# Patient Record
Sex: Female | Born: 2000 | Race: Black or African American | Hispanic: No | Marital: Single | Smoking: Current every day smoker
Health system: Southern US, Community
[De-identification: ages and names within clinical notes are randomized; demographics above are authoritative.]

## PROBLEM LIST (undated history)

## (undated) ENCOUNTER — Emergency Department (HOSPITAL_COMMUNITY): Admission: EM | Payer: Medicaid Other | Source: Home / Self Care

---
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
## 2020-04-20 ENCOUNTER — Encounter (HOSPITAL_COMMUNITY): Payer: Self-pay | Admitting: Emergency Medicine

## 2020-04-20 ENCOUNTER — Emergency Department (HOSPITAL_COMMUNITY)
Admission: EM | Admit: 2020-04-20 | Discharge: 2020-04-21 | Disposition: A | Discharge status: Home / Self Care | Payer: Medicaid Other | Attending: Emergency Medicine | Admitting: Emergency Medicine

## 2020-04-20 DIAGNOSIS — Z5321 Procedure and treatment not carried out due to patient leaving prior to being seen by health care provider: Secondary | ICD-10-CM | POA: Diagnosis not present

## 2020-04-20 DIAGNOSIS — R300 Dysuria: Secondary | ICD-10-CM | POA: Insufficient documentation

## 2020-04-20 DIAGNOSIS — N899 Noninflammatory disorder of vagina, unspecified: Secondary | ICD-10-CM | POA: Insufficient documentation

## 2020-04-20 NOTE — ED Notes (Signed)
Pt called for rooming. No answer x2!

## 2020-04-20 NOTE — ED Notes (Signed)
No answer from lobby  

## 2020-04-20 NOTE — ED Triage Notes (Signed)
Pt reports vaginal discharged that is white and dysuria for week.

## 2020-04-21 NOTE — ED Notes (Signed)
Pt called for rooming x3! No answer! 

## 2020-04-25 ENCOUNTER — Emergency Department (HOSPITAL_COMMUNITY)
Admission: EM | Admit: 2020-04-25 | Discharge: 2020-04-25 | Disposition: A | Discharge status: Home / Self Care | Payer: Medicaid Other | Attending: Emergency Medicine | Admitting: Emergency Medicine

## 2020-04-25 ENCOUNTER — Encounter (HOSPITAL_COMMUNITY): Payer: Self-pay | Admitting: Emergency Medicine

## 2020-04-25 ENCOUNTER — Other Ambulatory Visit: Payer: Self-pay

## 2020-04-25 DIAGNOSIS — R300 Dysuria: Secondary | ICD-10-CM

## 2020-04-25 DIAGNOSIS — N760 Acute vaginitis: Secondary | ICD-10-CM | POA: Diagnosis not present

## 2020-04-25 LAB — WET PREP, GENITAL
Clue Cells Wet Prep HPF POC: NONE SEEN
Sperm: NONE SEEN
Trich, Wet Prep: NONE SEEN
Yeast Wet Prep HPF POC: NONE SEEN

## 2020-04-25 LAB — URINALYSIS, ROUTINE W REFLEX MICROSCOPIC
Bacteria, UA: NONE SEEN
Bilirubin Urine: NEGATIVE
Glucose, UA: NEGATIVE mg/dL
Hgb urine dipstick: NEGATIVE
Ketones, ur: NEGATIVE mg/dL
Nitrite: NEGATIVE
Protein, ur: NEGATIVE mg/dL
Specific Gravity, Urine: 1.016 (ref 1.005–1.030)
pH: 6 (ref 5.0–8.0)

## 2020-04-25 LAB — POC URINE PREG, ED: Preg Test, Ur: NEGATIVE

## 2020-04-25 MED ORDER — FLUCONAZOLE 100 MG PO TABS
100.0000 mg | ORAL_TABLET | Freq: Every day | ORAL | 0 refills | Status: DC
Start: 2020-04-30 — End: 2021-03-15

## 2020-04-25 MED ORDER — NITROFURANTOIN MONOHYD MACRO 100 MG PO CAPS
100.0000 mg | ORAL_CAPSULE | Freq: Two times a day (BID) | ORAL | 0 refills | Status: DC
Start: 2020-04-25 — End: 2021-11-09

## 2020-04-25 NOTE — ED Provider Notes (Signed)
[facility] DEPT Provider Note   CSN: [id number] Arrival date & time: 04/25/20  1057     History Chief Complaint  Patient presents with  . Vaginal Discharge  . Dysuria    [facility] is a 19 y.o. female.  HPI     19 year old female comes in a chief complaint of burning with urination.  Patient reports that she has been having some burning with urination and itching in the vaginal area for the last week.  She denies any abdominal pain.  She has mild vaginal discharge.  No recent antibiotic use.  Patient is having unprotected intercourse with her partner.  No recent history of STI. History reviewed. No pertinent past medical history.  There are no problems to display for this patient.   History reviewed. No pertinent surgical history.   OB History   No obstetric history on file.     No family history on file.  Social History   Tobacco Use  . Smoking status: Never Smoker  . Smokeless tobacco: Never Used  Substance Use Topics  . Alcohol use: No  . Drug use: Not on file    Home Medications Prior to Admission medications   Medication Sig Start Date End Date Taking? Authorizing Provider  cephALEXin (KEFLEX) 500 MG capsule Take 1 capsule (500 mg total) by mouth 2 (two) times daily. Patient not taking: Reported on 09/16/2018 01/10/18   Langston Masker B, PA-C  fluconazole (DIFLUCAN) 100 MG tablet Take 1 tablet (100 mg total) by mouth daily. To be taken after finishing antibiotics for uti 04/30/20   Varney Biles, MD  metroNIDAZOLE (FLAGYL) 500 MG tablet Take 1 tablet (500 mg total) by mouth 2 (two) times daily. Patient not taking: Reported on 09/16/2018 01/10/18   Albesa Seen, PA-C  nitrofurantoin, macrocrystal-monohydrate, (MACROBID) 100 MG capsule Take 1 capsule (100 mg total) by mouth 2 (two) times daily. 04/25/20   Varney Biles, MD  ondansetron (ZOFRAN) 4 MG tablet Take 1 tablet (4 mg total) by mouth every 6 (six)  hours. Patient not taking: Reported on 01/10/2018 09/03/15   Louanne Skye, MD    Allergies    Patient has no known allergies.  Review of Systems   Review of Systems  Constitutional: Positive for activity change.  Genitourinary: Positive for dysuria and vaginal discharge.    Physical Exam Updated Vital Signs BP 129/87   Pulse 65   Temp 98.3 F (36.8 C) (Oral)   Resp 16   LMP 04/05/2020   SpO2 98%   Physical Exam Vitals and nursing note reviewed.  Constitutional:      Appearance: She is well-developed.  HENT:     Head: Normocephalic and atraumatic.  Cardiovascular:     Rate and Rhythm: Normal rate.  Pulmonary:     Effort: Pulmonary effort is normal.  Abdominal:     General: Bowel sounds are normal.  Genitourinary:    Vagina: Vaginal discharge present.  Musculoskeletal:     Cervical back: Normal range of motion and neck supple.  Skin:    General: Skin is warm and dry.  Neurological:     Mental Status: She is alert and oriented to person, place, and time.     ED Results / Procedures / Treatments   Labs (all labs ordered are listed, but only abnormal results are displayed) Labs Reviewed  WET PREP, GENITAL - Abnormal; Notable for the following components:      Result Value   WBC, Wet Prep HPF  POC FEW (*)    All other components within normal limits  URINALYSIS, ROUTINE W REFLEX MICROSCOPIC - Abnormal; Notable for the following components:   APPearance HAZY (*)    Leukocytes,Ua LARGE (*)    All other components within normal limits  POC URINE PREG, ED  GC/CHLAMYDIA PROBE AMP ([place]) NOT AT [facility]    EKG None  Radiology No results found.  Procedures Procedures (including critical care time)  Medications Ordered in ED Medications - No data to display  ED Course  I have reviewed the triage vital signs and the nursing notes.  Pertinent labs & imaging results that were available during my care of the patient were reviewed by me and considered in my  medical decision making (see chart for details).    MDM Rules/Calculators/A&P                          19 year old female comes in a chief complaint of burning with urination and vaginal discharge. We will start her on Macrobid for suspected UTI. Diflucan has been given, she has been advised to take it after her antibiotic course. No abdominal pain.  Doubt PID.  Final Clinical Impression(s) / ED Diagnoses Final diagnoses:  Dysuria  Acute vaginitis    Rx / DC Orders ED Discharge Orders         Ordered    fluconazole (DIFLUCAN) 100 MG tablet  Daily     Discontinue  Reprint     04/25/20 1524    nitrofurantoin, macrocrystal-monohydrate, (MACROBID) 100 MG capsule  2 times daily     Discontinue  Reprint     04/25/20 1523           Derwood Kaplan, MD 04/25/20 1558

## 2020-04-25 NOTE — ED Notes (Signed)
PT provided urine specimen cup. 

## 2020-04-25 NOTE — ED Notes (Signed)
Contacted lab regarding wet prep. Female staff stated he would look for specimens at this time.

## 2020-04-25 NOTE — ED Triage Notes (Signed)
Pt c/o dysuria and vaginal irritation and white discharge for over a week.

## 2020-04-25 NOTE — ED Notes (Signed)
Visitor at bedside.

## 2020-04-25 NOTE — Discharge Instructions (Addendum)
Please take the antibiotics for a bladder infection.  We have also given you prescription for Diflucan, you need to take that medication after you have completed taking UTI antibiotics to treat for yeast infection.

## 2020-04-26 LAB — GC/CHLAMYDIA PROBE AMP (~~LOC~~) NOT AT ARMC
Chlamydia: NEGATIVE
Comment: NEGATIVE
Comment: NORMAL
Neisseria Gonorrhea: NEGATIVE

## 2020-08-17 ENCOUNTER — Other Ambulatory Visit: Payer: Medicaid Other

## 2020-08-17 ENCOUNTER — Ambulatory Visit: Payer: Medicaid Other

## 2020-11-16 ENCOUNTER — Other Ambulatory Visit: Payer: Self-pay

## 2020-11-21 ENCOUNTER — Other Ambulatory Visit: Payer: Self-pay

## 2020-11-21 ENCOUNTER — Emergency Department (HOSPITAL_COMMUNITY)
Admission: EM | Admit: 2020-11-21 | Discharge: 2020-11-21 | Disposition: A | Discharge status: Home / Self Care | Payer: Medicaid Other | Attending: Emergency Medicine | Admitting: Emergency Medicine

## 2020-11-21 ENCOUNTER — Encounter (HOSPITAL_COMMUNITY): Payer: Self-pay

## 2020-11-21 DIAGNOSIS — N3090 Cystitis, unspecified without hematuria: Secondary | ICD-10-CM

## 2020-11-21 DIAGNOSIS — R350 Frequency of micturition: Secondary | ICD-10-CM

## 2020-11-21 DIAGNOSIS — R300 Dysuria: Secondary | ICD-10-CM | POA: Diagnosis present

## 2020-11-21 LAB — PREGNANCY, URINE: Preg Test, Ur: NEGATIVE

## 2020-11-21 LAB — URINALYSIS, ROUTINE W REFLEX MICROSCOPIC
Bilirubin Urine: NEGATIVE
Glucose, UA: NEGATIVE mg/dL
Hgb urine dipstick: NEGATIVE
Ketones, ur: NEGATIVE mg/dL
Leukocytes,Ua: NEGATIVE
Nitrite: NEGATIVE
Protein, ur: NEGATIVE mg/dL
Specific Gravity, Urine: 1.026 (ref 1.005–1.030)
pH: 5 (ref 5.0–8.0)

## 2020-11-21 LAB — CBG MONITORING, ED: Glucose-Capillary: 76 mg/dL (ref 70–99)

## 2020-11-21 LAB — I-STAT BETA HCG BLOOD, ED (MC, WL, AP ONLY): I-stat hCG, quantitative: <5 m[IU]/mL (ref <5)

## 2020-11-21 NOTE — ED Provider Notes (Signed)
[facility]   CSN: [id number] Arrival date & time: 11/21/20  1733     History Chief Complaint  Patient presents with  . Urinary Frequency  . Dysuria    [facility] is a 20 y.o. female with no pertinent past medical history that presents to the emergency department today for dysuria and urinary frequency for the past week and a half.  Denies any hematuria, denies any chance of pregnancy.  Patient states that she otherwise feels fine.  Denies any suprapubic pain, abdominal pain, back pain, nausea, vomiting, fevers, chills.  Denies any chest pain.  Patient states that she is generally healthy.  States that she has had UTIs in the past, feels similar.  Denies any vaginal symptoms, vaginal discharge or pelvic pain.  Denies any vaginal itching.  Patient states that she is sure that dysuria is coming from her urethra and on her vagina.  No chance of STDs.  No other complaints at this time.  Has not tried anything for this.  HPI     History reviewed. No pertinent past medical history.  There are no problems to display for this patient.   History reviewed. No pertinent surgical history.   OB History   No obstetric history on file.     Family History  Problem Relation Age of Onset  . Healthy Mother   . Healthy Father     Social History   Tobacco Use  . Smoking status: Never Smoker  . Smokeless tobacco: Never Used  Vaping Use  . Vaping Use: Every day  . Substances: Nicotine  Substance Use Topics  . Alcohol use: No  . Drug use: Yes    Types: Marijuana    Home Medications Prior to Admission medications   Medication Sig Start Date End Date Taking? Authorizing Provider  cephALEXin (KEFLEX) 500 MG capsule Take 1 capsule (500 mg total) by mouth 2 (two) times daily. Patient not taking: Reported on 09/16/2018 01/10/18   Aviva Kluver B, PA-C  fluconazole (DIFLUCAN) 100 MG tablet Take 1 tablet (100 mg total) by mouth  daily. To be taken after finishing antibiotics for uti 04/30/20   Derwood Kaplan, MD  metroNIDAZOLE (FLAGYL) 500 MG tablet Take 1 tablet (500 mg total) by mouth 2 (two) times daily. Patient not taking: Reported on 09/16/2018 01/10/18   Elisha Ponder, PA-C  nitrofurantoin, macrocrystal-monohydrate, (MACROBID) 100 MG capsule Take 1 capsule (100 mg total) by mouth 2 (two) times daily. 04/25/20   Derwood Kaplan, MD  ondansetron (ZOFRAN) 4 MG tablet Take 1 tablet (4 mg total) by mouth every 6 (six) hours. Patient not taking: Reported on 01/10/2018 09/03/15   Niel Hummer, MD    Allergies    Patient has no known allergies.  Review of Systems   Review of Systems  Constitutional: Negative for diaphoresis, fatigue and fever.  Eyes: Negative for visual disturbance.  Respiratory: Negative for shortness of breath.   Cardiovascular: Negative for chest pain.  Gastrointestinal: Negative for nausea and vomiting.  Genitourinary: Positive for dysuria and frequency. Negative for decreased urine volume, enuresis, flank pain, genital sores, hematuria, menstrual problem, pelvic pain, urgency, vaginal bleeding and vaginal discharge.  Musculoskeletal: Negative for back pain and myalgias.  Skin: Negative for color change, pallor, rash and wound.  Neurological: Negative for syncope, weakness, light-headedness, numbness and headaches.  Psychiatric/Behavioral: Negative for behavioral problems and confusion.    Physical Exam Updated Vital Signs BP 122/70 (BP Location: Left Arm)   Pulse  92   Temp 98.7 F (37.1 C) (Oral)   Resp 16   Ht 5\' 3"  (1.6 m)   Wt 58.5 kg   LMP 11/03/2020 (Approximate)   SpO2 97%   BMI 22.85 kg/m   Physical Exam Constitutional:      General: She is not in acute distress.    Appearance: Normal appearance. She is not ill-appearing, toxic-appearing or diaphoretic.  HENT:     Head: Normocephalic and atraumatic.  Eyes:     Extraocular Movements: Extraocular movements intact.      Pupils: Pupils are equal, round, and reactive to light.  Cardiovascular:     Rate and Rhythm: Normal rate and regular rhythm.     Pulses: Normal pulses.  Pulmonary:     Effort: Pulmonary effort is normal.     Breath sounds: Normal breath sounds.  Abdominal:     General: Abdomen is flat. There is no distension.     Tenderness: There is no abdominal tenderness. There is no right CVA tenderness, left CVA tenderness or guarding.  Musculoskeletal:        General: Normal range of motion.  Skin:    General: Skin is warm and dry.     Capillary Refill: Capillary refill takes less than 2 seconds.  Neurological:     General: No focal deficit present.     Mental Status: She is alert and oriented to person, place, and time. Mental status is at baseline.  Psychiatric:        Mood and Affect: Mood normal.        Behavior: Behavior normal.        Thought Content: Thought content normal.     ED Results / Procedures / Treatments   Labs (all labs ordered are listed, but only abnormal results are displayed) Labs Reviewed  URINALYSIS, ROUTINE W REFLEX MICROSCOPIC - Abnormal; Notable for the following components:      Result Value   APPearance HAZY (*)    All other components within normal limits  PREGNANCY, URINE  I-STAT BETA HCG BLOOD, ED ([facility], [facility], [facility] ONLY)  CBG MONITORING, ED    EKG None  Radiology No results found.  Procedures Procedures (including critical care time)  Medications Ordered in ED Medications - No data to display  ED Course  I have reviewed the triage vital signs and the nursing notes.  Pertinent labs & imaging results that were available during my care of the patient were reviewed by me and considered in my medical decision making (see chart for details).    MDM Rules/Calculators/A&P                          [facility] is a 20 y.o. female with no pertinent past medical history that presents to the emergency department today for dysuria and urinary  frequency for the past week and a half.   Urinalysis negative for infection.CBG 76, did give some cranberry juice. Patient with normal vital signs, no concerns for pyelonephritis.  Patient to be discharged at this time, symptomatic treatment discussed.  Patient follow-up with PCP.  Doubt need for further emergent work up at this time. I explained the diagnosis and have given explicit precautions to return to the ER including for any other new or worsening symptoms. The patient understands and accepts the medical plan as it's been dictated and I have answered their questions. Discharge instructions concerning home care and prescriptions have been given. The patient  is STABLE and is discharged to home in good condition.    Final Clinical Impression(s) / ED Diagnoses Final diagnoses:  Cystitis  Urinary frequency    Rx / DC Orders ED Discharge Orders    None       Farrel Gordon, PA-C 11/21/20 2203    Rozelle Logan, DO 11/21/20 2339

## 2020-11-21 NOTE — ED Triage Notes (Addendum)
Patient c/o dysuria and  Urinary frequncy x 1 1/2 weeks.

## 2020-11-21 NOTE — Discharge Instructions (Addendum)
Your urine was clean today, as we discussed, should follow-up with your primary care provider, if you do not have 1 you can go to [facility].  You can take the over-the-counter cranberry pills as we discussed.  If you have any new or worsening concerning symptoms please come back to the emergency department.

## 2021-03-14 ENCOUNTER — Emergency Department (HOSPITAL_COMMUNITY)
Admission: EM | Admit: 2021-03-14 | Discharge: 2021-03-15 | Disposition: A | Discharge status: Home / Self Care | Payer: Medicaid Other | Attending: Emergency Medicine | Admitting: Emergency Medicine

## 2021-03-14 DIAGNOSIS — B373 Candidiasis of vulva and vagina: Secondary | ICD-10-CM | POA: Diagnosis not present

## 2021-03-14 DIAGNOSIS — R300 Dysuria: Secondary | ICD-10-CM | POA: Insufficient documentation

## 2021-03-14 DIAGNOSIS — R3915 Urgency of urination: Secondary | ICD-10-CM | POA: Diagnosis not present

## 2021-03-14 DIAGNOSIS — R350 Frequency of micturition: Secondary | ICD-10-CM | POA: Insufficient documentation

## 2021-03-14 DIAGNOSIS — Z5321 Procedure and treatment not carried out due to patient leaving prior to being seen by health care provider: Secondary | ICD-10-CM | POA: Insufficient documentation

## 2021-03-14 LAB — URINALYSIS, ROUTINE W REFLEX MICROSCOPIC
Bilirubin Urine: NEGATIVE
Glucose, UA: NEGATIVE mg/dL
Hgb urine dipstick: NEGATIVE
Ketones, ur: 5 mg/dL — AB
Leukocytes,Ua: NEGATIVE
Nitrite: NEGATIVE
Protein, ur: NEGATIVE mg/dL
Specific Gravity, Urine: 1.031 — ABNORMAL HIGH (ref 1.005–1.030)
pH: 6 (ref 5.0–8.0)

## 2021-03-14 LAB — POC URINE PREG, ED: Preg Test, Ur: NEGATIVE

## 2021-03-14 NOTE — ED Triage Notes (Signed)
Pt c/o dysuria, urinary frequency and urgency x 3 days. Denies fevers or any pain

## 2021-03-15 ENCOUNTER — Encounter (HOSPITAL_COMMUNITY): Payer: Self-pay

## 2021-03-15 ENCOUNTER — Emergency Department (HOSPITAL_COMMUNITY)
Admission: EM | Admit: 2021-03-15 | Discharge: 2021-03-15 | Disposition: A | Discharge status: Home / Self Care | Payer: Medicaid Other | Source: Home / Self Care | Attending: Emergency Medicine | Admitting: Emergency Medicine

## 2021-03-15 DIAGNOSIS — B373 Candidiasis of vulva and vagina: Secondary | ICD-10-CM

## 2021-03-15 DIAGNOSIS — B3731 Acute candidiasis of vulva and vagina: Secondary | ICD-10-CM

## 2021-03-15 LAB — URINALYSIS, ROUTINE W REFLEX MICROSCOPIC
Bilirubin Urine: NEGATIVE
Glucose, UA: NEGATIVE mg/dL
Hgb urine dipstick: NEGATIVE
Ketones, ur: NEGATIVE mg/dL
Leukocytes,Ua: NEGATIVE
Nitrite: NEGATIVE
Protein, ur: NEGATIVE mg/dL
Specific Gravity, Urine: 1.023 (ref 1.005–1.030)
pH: 5 (ref 5.0–8.0)

## 2021-03-15 LAB — WET PREP, GENITAL
Clue Cells Wet Prep HPF POC: NONE SEEN
Sperm: NONE SEEN
Trich, Wet Prep: NONE SEEN
WBC, Wet Prep HPF POC: NONE SEEN

## 2021-03-15 LAB — PREGNANCY, URINE: Preg Test, Ur: NEGATIVE

## 2021-03-15 MED ORDER — STERILE WATER FOR INJECTION IJ SOLN
INTRAMUSCULAR | Status: AC
  Filled 2021-03-15: qty 10

## 2021-03-15 MED ORDER — CEFTRIAXONE SODIUM 1 G IJ SOLR
500.0000 mg | Freq: Once | INTRAMUSCULAR | Status: AC
  Administered 2021-03-15: 500 mg via INTRAMUSCULAR
  Filled 2021-03-15: qty 10

## 2021-03-15 MED ORDER — HYDROMORPHONE HCL 1 MG/ML IJ SOLN: 1.0000 mg | Freq: Once | INTRAMUSCULAR | Status: DC

## 2021-03-15 MED ORDER — DOXYCYCLINE HYCLATE 100 MG PO CAPS
100.0000 mg | ORAL_CAPSULE | Freq: Two times a day (BID) | ORAL | 0 refills | Status: AC
Start: 2021-03-15 — End: 2021-03-22

## 2021-03-15 MED ORDER — FLUCONAZOLE 200 MG PO TABS
200.0000 mg | ORAL_TABLET | Freq: Every day | ORAL | 0 refills | Status: AC
Start: 2021-03-15 — End: 2021-03-16

## 2021-03-15 MED ORDER — DOXYCYCLINE HYCLATE 100 MG PO TABS
100.0000 mg | ORAL_TABLET | Freq: Once | ORAL | Status: AC
  Administered 2021-03-15: 100 mg via ORAL
  Filled 2021-03-15: qty 1

## 2021-03-15 NOTE — ED Notes (Signed)
An After Visit Summary was printed and given to the patient. Discharge instructions given and no further questions at this time.  

## 2021-03-15 NOTE — ED Provider Notes (Signed)
[facility]-EMERGENCY DEPT Provider Note   CSN: [id number] Arrival date & time: 03/15/21  1423     History Chief Complaint  Patient presents with  . Urinary Urgency    Stacey Nguyen is a 20 y.o. female who presents for evaluation of urinary frequency x 4 days.  She reports that she has had frequency but has not had any dysuria or hematuria.  She denies any vaginal pain, vaginal irritation, wounds.  She states that she has not had any redness or swelling of the vagina.  She has noted some increased vaginal discharge.  She states she is currently not sexually active and is not concerned about STDs.  Her LMP was 03/02/2021.  She denies any fevers, abdominal pain, nausea/vomiting.  The history is provided by the patient.       History reviewed. No pertinent past medical history.  There are no problems to display for this patient.   History reviewed. No pertinent surgical history.   OB History   No obstetric history on file.     Family History  Problem Relation Age of Onset  . Healthy Mother   . Healthy Father     Social History   Tobacco Use  . Smoking status: Never Smoker  . Smokeless tobacco: Never Used  Vaping Use  . Vaping Use: Every day  . Substances: Nicotine  Substance Use Topics  . Alcohol use: No  . Drug use: Yes    Types: Marijuana    Home Medications Prior to Admission medications   Medication Sig Start Date End Date Taking? Authorizing Provider  doxycycline (VIBRAMYCIN) 100 MG capsule Take 1 capsule (100 mg total) by mouth 2 (two) times daily for 7 days. 03/15/21 03/22/21 Yes Maxwell Caul, PA-C  fluconazole (DIFLUCAN) 200 MG tablet Take 1 tablet (200 mg total) by mouth daily for 1 day. 03/15/21 03/16/21 Yes Maxwell Caul, PA-C  cephALEXin (KEFLEX) 500 MG capsule Take 1 capsule (500 mg total) by mouth 2 (two) times daily. Patient not taking: Reported on 09/16/2018 01/10/18   Aviva Kluver B, PA-C  metroNIDAZOLE (FLAGYL) 500  MG tablet Take 1 tablet (500 mg total) by mouth 2 (two) times daily. Patient not taking: Reported on 09/16/2018 01/10/18   Elisha Ponder, PA-C  nitrofurantoin, macrocrystal-monohydrate, (MACROBID) 100 MG capsule Take 1 capsule (100 mg total) by mouth 2 (two) times daily. 04/25/20   Derwood Kaplan, MD  ondansetron (ZOFRAN) 4 MG tablet Take 1 tablet (4 mg total) by mouth every 6 (six) hours. Patient not taking: Reported on 01/10/2018 09/03/15   Niel Hummer, MD    Allergies    Patient has no known allergies.  Review of Systems   Review of Systems  Constitutional: Negative for fever.  Gastrointestinal: Negative for abdominal pain, nausea and vomiting.  Genitourinary: Positive for frequency, vaginal bleeding and vaginal discharge. Negative for dysuria, hematuria and vaginal pain.  All other systems reviewed and are negative.   Physical Exam Updated Vital Signs BP 122/73   Pulse 77   Temp 98.4 F (36.9 C) (Oral)   Resp 18   LMP 03/02/2021   SpO2 99%   Physical Exam Vitals and nursing note reviewed.  Constitutional:      Appearance: She is well-developed.  HENT:     Head: Normocephalic and atraumatic.  Eyes:     General: No scleral icterus.       Right eye: No discharge.        Left eye: No discharge.  Conjunctiva/sclera: Conjunctivae normal.  Pulmonary:     Effort: Pulmonary effort is normal.  Abdominal:     Comments: Abdomen is soft, non-distended, non-tender. No rigidity, No guarding. No peritoneal signs. No CVA tenderness noted bilaterally.   Skin:    General: Skin is warm and dry.  Neurological:     Mental Status: She is alert.  Psychiatric:        Speech: Speech normal.        Behavior: Behavior normal.     ED Results / Procedures / Treatments   Labs (all labs ordered are listed, but only abnormal results are displayed) Labs Reviewed  WET PREP, GENITAL - Abnormal; Notable for the following components:      Result Value   Yeast Wet Prep HPF POC PRESENT (*)     All other components within normal limits  URINALYSIS, ROUTINE W REFLEX MICROSCOPIC - Abnormal; Notable for the following components:   APPearance CLOUDY (*)    All other components within normal limits  PREGNANCY, URINE  GC/CHLAMYDIA PROBE AMP ([place]) NOT AT [facility]    EKG None  Radiology No results found.  Procedures Procedures   Medications Ordered in ED Medications  sterile water (preservative free) injection (has no administration in time range)  cefTRIAXone (ROCEPHIN) injection 500 mg (500 mg Intramuscular Given 03/15/21 1745)  doxycycline (VIBRA-TABS) tablet 100 mg (100 mg Oral Given 03/15/21 1745)    ED Course  I have reviewed the triage vital signs and the nursing notes.  Pertinent labs & imaging results that were available during my care of the patient were reviewed by me and considered in my medical decision making (see chart for details).    MDM Rules/Calculators/A&P                          20 year old female who presents for evaluation of urinary frequency x4 days.  She states no dysuria, hematuria, vaginal pain, vaginal bleeding.  She has had increased vaginal discharge.  She is not concerned about STDs and states she is not currently sexually active.  On initial arrival, she is afebrile nontoxic-appearing.  Vital signs are stable.  She has a benign abdominal exam.  We will plan to check urine.  Consider UTI.  History/physical exam not concerning for kidney stone, ovarian torsion, appendicitis, abscess.  Urine pregnancy is negative.  UA shows no evidence of infectious etiology.  I discussed results with patient.  We discussed further treatment options here in the emergency department.  I offered full pelvic exam for evaluation and STD testing.  We also discussed her doing self swab.  At this time, patient wanted to decline the pelvic exam.  But she was interested in doing a wet prep and STD testing by doing self swab.  Wet prep shows yeast.  No clue cells  noted.  Discussed results with patient.  I discussed with her regarding the yeast.  We will plan to treat.  We discussed that the STD testing will take a few days to come back.  Discussed treatment options.  Patient would like to go in and have presumptive treatment.  Patient with no known drug allergies.  At this time, patient exhibits no emergent life-threatening condition that require further evaluation in ED. Patient had ample opportunity for questions and discussion. All patient's questions were answered with full understanding. Strict return precautions discussed. Patient expresses understanding and agreement to plan.   Portions of this note were generated with Dragon  dictation software. Dictation errors may occur despite best attempts at proofreading.   Final Clinical Impression(s) / ED Diagnoses Final diagnoses:  Yeast vaginitis    Rx / DC Orders ED Discharge Orders         Ordered    fluconazole (DIFLUCAN) 200 MG tablet  Daily        03/15/21 1741    doxycycline (VIBRAMYCIN) 100 MG capsule  2 times daily        03/15/21 1741           Rosana Hoes 03/15/21 1942    Maia Plan, MD 03/18/21 2337

## 2021-03-15 NOTE — ED Triage Notes (Signed)
Pt presents with c/o urinary urgency for 4 days. Pt was seen for the same last night, reports she never received a call with any results.

## 2021-03-15 NOTE — ED Notes (Signed)
When attempt to collect updated vitals pt info me she was not a pt in system.

## 2021-03-15 NOTE — Discharge Instructions (Signed)
You have been treated today for an STD.   Take antibiotics as directed. Please take all of your antibiotics until finished.  As we discussed, you will take the Diflucan pill after you have completed your antibiotics.  The test results with take 2-3 days to return. If there is an abnormal result, you will be notified. If you do not hear anything, that means the results were negative. You can also log on MyChart to see the results.   Your sexual partner needs to be treated too. Do not have sexual intercourse for the next 7 days and after your partner has been treated.   Follow-up with your primary care doctor in 2-4 days. If you do not have a primary care doctor, you can use one listed in the paperwork.   Return to the Emergency Department for any fever, abdominal pain, difficulty breathing, nausea/vomiting or any other worsening or concerning symptoms.

## 2021-03-15 NOTE — ED Provider Notes (Signed)
Emergency Medicine Provider Triage Evaluation Note  Stacey Nguyen 20 y.o. female was evaluated in triage.  Pt complains of urinary urgency that has been ongoing for 4 days.  She came her last night but was never evaluated.  She comes back today because she is continue to have symptoms.  No dysuria, hematuria.  No redness, swelling of her vagina.  She has had some increased vaginal discharge.  She is not currently sexually active.  Last LMP was 03/02/2021.  No fevers, abdominal pain, nausea/vomiting.  Review of Systems  Positive: Urinary frequency Negative: Fevers, abd pain, nausea/vomiting  Physical Exam  BP 134/82   Pulse 70   Temp 98.2 F (36.8 C) (Oral)   Resp 18   Ht 5\' 4"  (1.626 m)   Wt 65.8 kg   SpO2 100%   BMI 24.89 kg/m  Gen:   Awake, no distress   HEENT:  Atraumatic  Resp:  Normal effort  Cardiac:  Normal rate  Abd:   Nondistended, nontender  MSK:   Moves extremities without difficulty  Neuro:  Speech clear   Medical Decision Making  Medically screening exam initiated at 3:55 AM.  Appropriate orders placed.  Stacey Nguyen was informed that the remainder of the evaluation will be completed by another provider, this initial triage assessment does not replace that evaluation, and the importance of remaining in the ED until their evaluation is complete.    Clinical Impression  Urinary frequency   Portions of this note were generated with Dragon dictation software. Dictation errors may occur despite best attempts at proofreading.     Artist Pais, PA-C 03/15/21 1440    05/15/21, MD 03/16/21 [id number]

## 2021-03-16 LAB — GC/CHLAMYDIA PROBE AMP (~~LOC~~) NOT AT ARMC
Chlamydia: NEGATIVE
Comment: NEGATIVE
Comment: NORMAL
Neisseria Gonorrhea: NEGATIVE

## 2021-11-09 ENCOUNTER — Other Ambulatory Visit: Payer: Self-pay

## 2021-11-09 ENCOUNTER — Encounter (HOSPITAL_COMMUNITY): Payer: Self-pay

## 2021-11-09 ENCOUNTER — Emergency Department (HOSPITAL_COMMUNITY)
Admission: EM | Admit: 2021-11-09 | Discharge: 2021-11-10 | Disposition: A | Discharge status: Home / Self Care | Payer: Medicaid Other | Attending: Emergency Medicine | Admitting: Emergency Medicine

## 2021-11-09 DIAGNOSIS — N390 Urinary tract infection, site not specified: Secondary | ICD-10-CM | POA: Insufficient documentation

## 2021-11-09 DIAGNOSIS — U071 COVID-19: Secondary | ICD-10-CM | POA: Diagnosis not present

## 2021-11-09 DIAGNOSIS — R059 Cough, unspecified: Secondary | ICD-10-CM | POA: Diagnosis present

## 2021-11-09 LAB — URINALYSIS, ROUTINE W REFLEX MICROSCOPIC
Bilirubin Urine: NEGATIVE
Glucose, UA: NEGATIVE mg/dL
Ketones, ur: 20 mg/dL — AB
Nitrite: NEGATIVE
Protein, ur: NEGATIVE mg/dL
Specific Gravity, Urine: 1.014 (ref 1.005–1.030)
pH: 6 (ref 5.0–8.0)

## 2021-11-09 LAB — CBC WITH DIFFERENTIAL/PLATELET
Abs Immature Granulocytes: 0.01 10*3/uL (ref 0.00–0.07)
Basophils Absolute: 0.1 10*3/uL (ref 0.0–0.1)
Basophils Relative: 2 %
Eosinophils Absolute: 0 10*3/uL (ref 0.0–0.5)
Eosinophils Relative: 1 %
HCT: 39.7 % (ref 36.0–46.0)
Hemoglobin: 12.6 g/dL (ref 12.0–15.0)
Immature Granulocytes: 0 %
Lymphocytes Relative: 26 %
Lymphs Abs: 1.2 10*3/uL (ref 0.7–4.0)
MCH: 29.1 pg (ref 26.0–34.0)
MCHC: 31.7 g/dL (ref 30.0–36.0)
MCV: 91.7 fL (ref 80.0–100.0)
Monocytes Absolute: 0.5 10*3/uL (ref 0.1–1.0)
Monocytes Relative: 11 %
Neutro Abs: 2.8 10*3/uL (ref 1.7–7.7)
Neutrophils Relative %: 60 %
Platelets: 271 10*3/uL (ref 150–400)
RBC: 4.33 MIL/uL (ref 3.87–5.11)
RDW: 12.3 % (ref 11.5–15.5)
WBC: 4.7 10*3/uL (ref 4.0–10.5)
nRBC: 0 % (ref 0.0–0.2)

## 2021-11-09 LAB — COMPREHENSIVE METABOLIC PANEL
ALT: 21 U/L (ref 0–44)
AST: 21 U/L (ref 15–41)
Albumin: 4.8 g/dL (ref 3.5–5.0)
Alkaline Phosphatase: 54 U/L (ref 38–126)
Anion gap: 9 (ref 5–15)
BUN: 13 mg/dL (ref 6–20)
CO2: 22 mmol/L (ref 22–32)
Calcium: 9.3 mg/dL (ref 8.9–10.3)
Chloride: 104 mmol/L (ref 98–111)
Creatinine, Ser: 0.82 mg/dL (ref 0.44–1.00)
GFR, Estimated: >60 mL/min (ref >60)
Glucose, Bld: 82 mg/dL (ref 70–99)
Potassium: 3.5 mmol/L (ref 3.5–5.1)
Sodium: 135 mmol/L (ref 135–145)
Total Bilirubin: 1.2 mg/dL (ref 0.3–1.2)
Total Protein: 8 g/dL (ref 6.5–8.1)

## 2021-11-09 LAB — RESP PANEL BY RT-PCR (FLU A&B, COVID) ARPGX2
Influenza A by PCR: NEGATIVE
Influenza B by PCR: NEGATIVE
SARS Coronavirus 2 by RT PCR: POSITIVE — AB

## 2021-11-09 LAB — LIPASE, BLOOD: Lipase: 24 U/L (ref 11–51)

## 2021-11-09 LAB — PREGNANCY, URINE: Preg Test, Ur: NEGATIVE

## 2021-11-09 NOTE — ED Triage Notes (Signed)
Pt reports intermittent abdominal pain with activity x1 week.

## 2021-11-09 NOTE — ED Provider Notes (Signed)
Emergency Medicine Provider Triage Evaluation Note  Stacey Nguyen , Nguyen 20 y.o. female  was evaluated in triage.  Pt complains of mid abdominal pain onset 1 week. Sick contacts of sibling with covid. Negative home covid test. Has associated nausea, dysuria x 1 week, dry cough. Tried OTC meds with no relief. Denies shortness of breath, hematuria, fever, chills, vomiting, rhinorrhea, congestion.   Review of Systems  Positive: Abdominal pain, nausea Negative: Shortness of breath  Physical Exam  BP (!) 147/95 (BP Location: Right Arm)    Pulse 95    Temp 98.2 F (36.8 C) (Oral)    Resp 16    Ht 5\' 3"  (1.6 m)    Wt 54.4 kg    LMP 10/22/2021 (Exact Date)    SpO2 99%    BMI 21.26 kg/m  Gen:   Awake, no distress   Resp:  Normal effort  MSK:   Moves extremities without difficulty  Other:  No abdominal tenderness to palpation.  Medical Decision Making  Medically screening exam initiated at 6:02 PM.  Appropriate orders placed.  Stacey Nguyen was informed that the remainder of the evaluation will be completed by another provider, this initial triage assessment does not replace that evaluation, and the importance of remaining in the ED until their evaluation is complete.   Stacey Graver A, PA-C 11/09/21 1911    11/11/21, MD 11/10/21 [phone]

## 2021-11-09 NOTE — ED Provider Notes (Signed)
[facility] DEPT Provider Note: Georgena Spurling, MD, FACEP  CSN: [id number] MRN: [id number] ARRIVAL: 11/09/21 at [place]  Abdominal Pain   HISTORY OF PRESENT ILLNESS  11/09/21 11:58 PM Stacey Nguyen is a 20 y.o. female with about a week of intermittent upper abdominal pain.  She rates it as a 5 out of 10.  It is present with activity and not present currently.  She has had dysuria and is concerned that she may have a urinary tract infection.  She denies vaginal bleeding or discharge.  She has had a cold (dry cough, no nasal congestion, no fever) for about a week as well.  She has not had vomiting or diarrhea.   History reviewed. No pertinent past medical history.  History reviewed. No pertinent surgical history.  Family History  Problem Relation Age of Onset   Healthy Mother    Healthy Father     Social History   Tobacco Use   Smoking status: Never   Smokeless tobacco: Never  Vaping Use   Vaping Use: Every day   Substances: Nicotine  Substance Use Topics   Alcohol use: No   Drug use: Yes    Types: Marijuana    Prior to Admission medications   Not on File    Allergies Patient has no known allergies.   REVIEW OF SYSTEMS  Negative except as noted here or in the History of Present Illness.   PHYSICAL EXAMINATION  Initial Vital Signs Blood pressure 125/83, pulse 71, temperature 98.8 F (37.1 C), temperature source Oral, resp. rate 16, height 5\' 3"  (1.6 m), weight 54.4 kg, last menstrual period 10/22/2021, SpO2 100 %.  Examination General: Well-developed, well-nourished female in no acute distress; appearance consistent with age of record HENT: normocephalic; atraumatic Eyes: Normal appearance Neck: supple Heart: regular rate and rhythm Lungs: clear to auscultation bilaterally Abdomen: soft; nondistended; nontender; bowel sounds present Extremities: No deformity; full range of motion; pulses normal Neurologic:  Awake, alert and oriented; motor function intact in all extremities and symmetric; no facial droop Skin: Warm and dry Psychiatric: Normal mood and affect   RESULTS  Summary of this visit's results, reviewed and interpreted by myself:   EKG Interpretation  Date/Time:    Ventricular Rate:    PR Interval:    QRS Duration:   QT Interval:    QTC Calculation:   R Axis:     Text Interpretation:         Laboratory Studies: Results for orders placed or performed during the hospital encounter of 11/09/21 (from the past 24 hour(s))  CBC with Differential     Status: None   Collection Time: 11/09/21  6:12 PM  Result Value Ref Range   WBC 4.7 4.0 - 10.5 K/uL   RBC 4.33 3.87 - 5.11 MIL/uL   Hemoglobin 12.6 12.0 - 15.0 g/dL   HCT 39.7 36.0 - 46.0 %   MCV 91.7 80.0 - 100.0 fL   MCH 29.1 26.0 - 34.0 pg   MCHC 31.7 30.0 - 36.0 g/dL   RDW 12.3 11.5 - 15.5 %   Platelets 271 150 - 400 K/uL   nRBC 0.0 0.0 - 0.2 %   Neutrophils Relative % 60 %   Neutro Abs 2.8 1.7 - 7.7 K/uL   Lymphocytes Relative 26 %   Lymphs Abs 1.2 0.7 - 4.0 K/uL   Monocytes Relative 11 %   Monocytes Absolute 0.5 0.1 - 1.0 K/uL   Eosinophils Relative 1 %  Eosinophils Absolute 0.0 0.0 - 0.5 K/uL   Basophils Relative 2 %   Basophils Absolute 0.1 0.0 - 0.1 K/uL   Immature Granulocytes 0 %   Abs Immature Granulocytes 0.01 0.00 - 0.07 K/uL  Comprehensive metabolic panel     Status: None   Collection Time: 11/09/21  6:12 PM  Result Value Ref Range   Sodium 135 135 - 145 mmol/L   Potassium 3.5 3.5 - 5.1 mmol/L   Chloride 104 98 - 111 mmol/L   CO2 22 22 - 32 mmol/L   Glucose, Bld 82 70 - 99 mg/dL   BUN 13 6 - 20 mg/dL   Creatinine, Ser 0.82 0.44 - 1.00 mg/dL   Calcium 9.3 8.9 - 10.3 mg/dL   Total Protein 8.0 6.5 - 8.1 g/dL   Albumin 4.8 3.5 - 5.0 g/dL   AST 21 15 - 41 U/L   ALT 21 0 - 44 U/L   Alkaline Phosphatase 54 38 - 126 U/L   Total Bilirubin 1.2 0.3 - 1.2 mg/dL   GFR, Estimated >60 >60 mL/min   Anion  gap 9 5 - 15  Lipase, blood     Status: None   Collection Time: 11/09/21  6:12 PM  Result Value Ref Range   Lipase 24 11 - 51 U/L  Resp Panel by RT-PCR (Flu A&B, Covid) Nasopharyngeal Swab     Status: Abnormal   Collection Time: 11/09/21  6:15 PM   Specimen: Nasopharyngeal Swab; Nasopharyngeal(NP) swabs in vial transport medium  Result Value Ref Range   SARS Coronavirus 2 by RT PCR POSITIVE (A) NEGATIVE   Influenza A by PCR NEGATIVE NEGATIVE   Influenza B by PCR NEGATIVE NEGATIVE  Urinalysis, Routine w reflex microscopic Urine, Clean Catch     Status: Abnormal   Collection Time: 11/09/21  9:42 PM  Result Value Ref Range   Color, Urine YELLOW YELLOW   APPearance HAZY (A) CLEAR   Specific Gravity, Urine 1.014 1.005 - 1.030   pH 6.0 5.0 - 8.0   Glucose, UA NEGATIVE NEGATIVE mg/dL   Hgb urine dipstick SMALL (A) NEGATIVE   Bilirubin Urine NEGATIVE NEGATIVE   Ketones, ur 20 (A) NEGATIVE mg/dL   Protein, ur NEGATIVE NEGATIVE mg/dL   Nitrite NEGATIVE NEGATIVE   Leukocytes,Ua LARGE (A) NEGATIVE   RBC / HPF 0-5 0 - 5 RBC/hpf   WBC, UA 11-20 0 - 5 WBC/hpf   Bacteria, UA FEW (A) NONE SEEN   Squamous Epithelial / LPF 6-10 0 - 5   Mucus PRESENT   Pregnancy, urine     Status: None   Collection Time: 11/09/21  9:42 PM  Result Value Ref Range   Preg Test, Ur NEGATIVE NEGATIVE   Imaging Studies: No results found.  ED COURSE and MDM  Nursing notes, initial and subsequent vitals signs, including pulse oximetry, reviewed and interpreted by myself.  Vitals:   11/09/21 1747 11/09/21 1748 11/09/21 1947  BP: (!) 147/95  125/83  Pulse: 95  71  Resp: 16  16  Temp: 98.2 F (36.8 C)  98.8 F (37.1 C)  TempSrc: Oral  Oral  SpO2: 99%  100%  Weight:  54.4 kg   Height:  5\' 3"  (1.6 m)    Medications  fosfomycin (MONUROL) packet 3 g (has no administration in time range)    Patient positive for COVID.  Her illness appears to be mild and antiviral treatment is not indicated.  We will treat her  for urinary tract infection given  pyuria and symptoms.  PROCEDURES  Procedures   ED DIAGNOSES     ICD-10-CM   1. Lower urinary tract infectious disease  N39.0     2. COVID-19 virus infection  U07.1          Jadian Karman, MD 11/10/21 0005

## 2021-11-10 MED ORDER — FOSFOMYCIN TROMETHAMINE 3 G PO PACK
3.0000 g | PACK | Freq: Once | ORAL | Status: AC
Start: 2021-11-10 — End: 2021-11-10
  Administered 2021-11-10: 3 g via ORAL
  Filled 2021-11-10: qty 3

## 2021-11-14 ENCOUNTER — Emergency Department (HOSPITAL_COMMUNITY)
Admission: EM | Admit: 2021-11-14 | Discharge: 2021-11-14 | Disposition: A | Discharge status: Home / Self Care | Payer: Medicaid Other | Attending: Student | Admitting: Student

## 2021-11-14 ENCOUNTER — Encounter (HOSPITAL_COMMUNITY): Payer: Self-pay | Admitting: Emergency Medicine

## 2021-11-14 ENCOUNTER — Emergency Department (HOSPITAL_COMMUNITY): Payer: Medicaid Other

## 2021-11-14 ENCOUNTER — Other Ambulatory Visit: Payer: Self-pay

## 2021-11-14 DIAGNOSIS — R1011 Right upper quadrant pain: Secondary | ICD-10-CM | POA: Insufficient documentation

## 2021-11-14 DIAGNOSIS — Z5321 Procedure and treatment not carried out due to patient leaving prior to being seen by health care provider: Secondary | ICD-10-CM | POA: Diagnosis not present

## 2021-11-14 DIAGNOSIS — R1012 Left upper quadrant pain: Secondary | ICD-10-CM | POA: Insufficient documentation

## 2021-11-14 LAB — URINALYSIS, ROUTINE W REFLEX MICROSCOPIC
Bacteria, UA: NONE SEEN
Bilirubin Urine: NEGATIVE
Glucose, UA: NEGATIVE mg/dL
Ketones, ur: 20 mg/dL — AB
Leukocytes,Ua: NEGATIVE
Nitrite: NEGATIVE
Protein, ur: 100 mg/dL — AB
RBC / HPF: >50 RBC/hpf — ABNORMAL HIGH (ref 0–5)
Specific Gravity, Urine: 1.019 (ref 1.005–1.030)
pH: 6 (ref 5.0–8.0)

## 2021-11-14 LAB — COMPREHENSIVE METABOLIC PANEL
ALT: 26 U/L (ref 0–44)
AST: 44 U/L — ABNORMAL HIGH (ref 15–41)
Albumin: 5.4 g/dL — ABNORMAL HIGH (ref 3.5–5.0)
Alkaline Phosphatase: 57 U/L (ref 38–126)
Anion gap: 9 (ref 5–15)
BUN: 8 mg/dL (ref 6–20)
CO2: 26 mmol/L (ref 22–32)
Calcium: 9.9 mg/dL (ref 8.9–10.3)
Chloride: 102 mmol/L (ref 98–111)
Creatinine, Ser: 0.93 mg/dL (ref 0.44–1.00)
GFR, Estimated: >60 mL/min (ref >60)
Glucose, Bld: 91 mg/dL (ref 70–99)
Potassium: 4.2 mmol/L (ref 3.5–5.1)
Sodium: 137 mmol/L (ref 135–145)
Total Bilirubin: 0.7 mg/dL (ref 0.3–1.2)
Total Protein: 9 g/dL — ABNORMAL HIGH (ref 6.5–8.1)

## 2021-11-14 LAB — CBC WITH DIFFERENTIAL/PLATELET
Abs Immature Granulocytes: 0.01 10*3/uL (ref 0.00–0.07)
Basophils Absolute: 0 10*3/uL (ref 0.0–0.1)
Basophils Relative: 1 %
Eosinophils Absolute: 0 10*3/uL (ref 0.0–0.5)
Eosinophils Relative: 1 %
HCT: 46.5 % — ABNORMAL HIGH (ref 36.0–46.0)
Hemoglobin: 15 g/dL (ref 12.0–15.0)
Immature Granulocytes: 0 %
Lymphocytes Relative: 45 %
Lymphs Abs: 1.6 10*3/uL (ref 0.7–4.0)
MCH: 29.2 pg (ref 26.0–34.0)
MCHC: 32.3 g/dL (ref 30.0–36.0)
MCV: 90.6 fL (ref 80.0–100.0)
Monocytes Absolute: 0.3 10*3/uL (ref 0.1–1.0)
Monocytes Relative: 10 %
Neutro Abs: 1.5 10*3/uL — ABNORMAL LOW (ref 1.7–7.7)
Neutrophils Relative %: 43 %
Platelets: 265 10*3/uL (ref 150–400)
RBC: 5.13 MIL/uL — ABNORMAL HIGH (ref 3.87–5.11)
RDW: 12.1 % (ref 11.5–15.5)
WBC: 3.4 10*3/uL — ABNORMAL LOW (ref 4.0–10.5)
nRBC: 0 % (ref 0.0–0.2)

## 2021-11-14 LAB — I-STAT BETA HCG BLOOD, ED (MC, WL, AP ONLY): I-stat hCG, quantitative: <5 m[IU]/mL (ref <5)

## 2021-11-14 LAB — LIPASE, BLOOD: Lipase: 24 U/L (ref 11–51)

## 2021-11-14 NOTE — ED Provider Notes (Signed)
Emergency Medicine Provider Triage Evaluation Note  Stacey Nguyen , a 21 y.o. female  was evaluated in triage.  Pt complains of bilateral upper abdominal/flank pain that started last night. Patient seen in the ED on 12/28 and diagnosed with UTI and COVID; however she notes she was not discharged with any antibiotics. She notes pain is worse with deep inspiration. Pain associated with hematuria. No history of kidney stones.   Review of Systems  Positive: Abdominal/flank pain Negative: fever  Physical Exam  BP 117/82 (BP Location: Left Arm)    Pulse 62    Temp 99.1 F (37.3 C) (Oral)    Resp 14    Ht 5\' 3"  (1.6 m)    Wt 56.7 kg    LMP 10/22/2021 (Exact Date)    SpO2 99%    BMI 22.14 kg/m  Gen:   Awake, no distress   Resp:  Normal effort  MSK:   Moves extremities without difficulty  Other:    Medical Decision Making  Medically screening exam initiated at 4:18 PM.  Appropriate orders placed.  Stacey Nguyen was informed that the remainder of the evaluation will be completed by another provider, this initial triage assessment does not replace that evaluation, and the importance of remaining in the ED until their evaluation is complete.  Labs CXR to rule out lower lobe pneumonia UA to rule out UTI Renal study to rule out kidney stone due to hematuria   Karie Kirks 11/14/21 1621    Drenda Freeze, MD 11/14/21 2156

## 2021-11-14 NOTE — ED Triage Notes (Signed)
PT c/o hematuria and bilateral flank pain since last night. States Covid positive on 12/29.

## 2021-11-26 ENCOUNTER — Emergency Department (HOSPITAL_COMMUNITY): Payer: Medicaid Other

## 2021-11-26 ENCOUNTER — Encounter (HOSPITAL_COMMUNITY): Payer: Self-pay | Admitting: Emergency Medicine

## 2021-11-26 ENCOUNTER — Emergency Department (HOSPITAL_COMMUNITY)
Admission: EM | Admit: 2021-11-26 | Discharge: 2021-11-26 | Disposition: A | Discharge status: Home / Self Care | Payer: Medicaid Other | Attending: Emergency Medicine | Admitting: Emergency Medicine

## 2021-11-26 DIAGNOSIS — N3000 Acute cystitis without hematuria: Secondary | ICD-10-CM | POA: Diagnosis not present

## 2021-11-26 DIAGNOSIS — R079 Chest pain, unspecified: Secondary | ICD-10-CM | POA: Insufficient documentation

## 2021-11-26 DIAGNOSIS — F17200 Nicotine dependence, unspecified, uncomplicated: Secondary | ICD-10-CM | POA: Diagnosis not present

## 2021-11-26 LAB — CBC WITH DIFFERENTIAL/PLATELET
Abs Immature Granulocytes: 0.02 10*3/uL (ref 0.00–0.07)
Basophils Absolute: 0.1 10*3/uL (ref 0.0–0.1)
Basophils Relative: 1 %
Eosinophils Absolute: 0.1 10*3/uL (ref 0.0–0.5)
Eosinophils Relative: 1 %
HCT: 38.6 % (ref 36.0–46.0)
Hemoglobin: 12.6 g/dL (ref 12.0–15.0)
Immature Granulocytes: 0 %
Lymphocytes Relative: 37 %
Lymphs Abs: 2.4 10*3/uL (ref 0.7–4.0)
MCH: 29.2 pg (ref 26.0–34.0)
MCHC: 32.6 g/dL (ref 30.0–36.0)
MCV: 89.4 fL (ref 80.0–100.0)
Monocytes Absolute: 0.4 10*3/uL (ref 0.1–1.0)
Monocytes Relative: 7 %
Neutro Abs: 3.5 10*3/uL (ref 1.7–7.7)
Neutrophils Relative %: 54 %
Platelets: 268 10*3/uL (ref 150–400)
RBC: 4.32 MIL/uL (ref 3.87–5.11)
RDW: 12 % (ref 11.5–15.5)
WBC: 6.4 10*3/uL (ref 4.0–10.5)
nRBC: 0 % (ref 0.0–0.2)

## 2021-11-26 LAB — URINALYSIS, ROUTINE W REFLEX MICROSCOPIC
Bilirubin Urine: NEGATIVE
Glucose, UA: NEGATIVE mg/dL
Hgb urine dipstick: NEGATIVE
Ketones, ur: 5 mg/dL — AB
Nitrite: NEGATIVE
Protein, ur: NEGATIVE mg/dL
Specific Gravity, Urine: 1.023 (ref 1.005–1.030)
pH: 5 (ref 5.0–8.0)

## 2021-11-26 LAB — BASIC METABOLIC PANEL
Anion gap: 9 (ref 5–15)
BUN: 14 mg/dL (ref 6–20)
CO2: 22 mmol/L (ref 22–32)
Calcium: 9.4 mg/dL (ref 8.9–10.3)
Chloride: 106 mmol/L (ref 98–111)
Creatinine, Ser: 0.75 mg/dL (ref 0.44–1.00)
GFR, Estimated: >60 mL/min (ref >60)
Glucose, Bld: 132 mg/dL — ABNORMAL HIGH (ref 70–99)
Potassium: 3.6 mmol/L (ref 3.5–5.1)
Sodium: 137 mmol/L (ref 135–145)

## 2021-11-26 LAB — TROPONIN I (HIGH SENSITIVITY): Troponin I (High Sensitivity): <2 ng/L (ref <18)

## 2021-11-26 LAB — D-DIMER, QUANTITATIVE: D-Dimer, Quant: <0.27 ug/mL-FEU (ref 0.00–0.50)

## 2021-11-26 MED ORDER — CEPHALEXIN 500 MG PO CAPS
500.0000 mg | ORAL_CAPSULE | Freq: Four times a day (QID) | ORAL | 0 refills | Status: DC
Start: 2021-11-26 — End: 2022-03-02

## 2021-11-26 MED ORDER — NAPROXEN 500 MG PO TABS: 500.0000 mg | ORAL_TABLET | Freq: Two times a day (BID) | ORAL | 0 refills | Status: DC

## 2021-11-26 NOTE — Discharge Instructions (Addendum)
Please read and follow all provided instructions.  Your diagnoses today include:  1. Nonspecific chest pain   2. Acute cystitis without hematuria     Tests performed today include: An EKG of your heart: no signs of a heart attack but was not completely normal, please have this rechecked by your doctor A chest x-ray: no signs of pneumonia or other problems Cardiac enzymes - a blood test for heart muscle damage Blood counts and electrolytes D-dimer-screening test for blood clot was negative Urine test-concern for UTI Vital signs. See below for your results today.   Medications prescribed:  Keflex (cephalexin) - antibiotic  You have been prescribed an antibiotic medicine: take the entire course of medicine even if you are feeling better. Stopping early can cause the antibiotic not to work.  Naproxen - anti-inflammatory pain medication Do not exceed 500mg  naproxen every 12 hours, take with food  You have been prescribed an anti-inflammatory medication or NSAID. Take with food. Take smallest effective dose for the shortest duration needed for your pain. Stop taking if you experience stomach pain or vomiting.   Take any prescribed medications only as directed.  Follow-up instructions: Please follow-up with your primary care provider as soon as you can for further evaluation of your symptoms.   Return instructions:  SEEK IMMEDIATE MEDICAL ATTENTION IF: You have severe chest pain, especially if the pain is crushing or pressure-like and spreads to the arms, back, neck, or jaw, or if you have sweating, nausea or vomiting, or trouble with breathing. THIS IS AN EMERGENCY. Do not wait to see if the pain will go away. Get medical help at once. Call 911. DO NOT drive yourself to the hospital.  Your chest pain gets worse and does not go away after a few minutes of rest.  You have an attack of chest pain lasting longer than what you usually experience.  You have significant dizziness, if you pass  out, or have trouble walking.  You have chest pain not typical of your usual pain for which you originally saw your caregiver.  You have any other emergent concerns regarding your health.  Additional Information: Chest pain comes from many different causes. Your caregiver has diagnosed you as having chest pain that is not specific for one problem, but does not require admission.  You are at low risk for an acute heart condition or other serious illness.   Your vital signs today were: BP 103/78 (BP Location: Left Arm)    Pulse 79    Temp (!) 97.5 F (36.4 C) (Oral)    Resp 16    LMP 11/16/2021    SpO2 100%  If your blood pressure (BP) was elevated above 135/85 this visit, please have this repeated by your doctor within one month. --------------

## 2021-11-26 NOTE — ED Triage Notes (Signed)
Patient c/o intermittent chest pain primarily at night since having Covid 12/29. Denies pain at this time. States she is concerned about elevated RBC since seeing results on from 1/2 on MyChart.

## 2021-11-26 NOTE — ED Provider Notes (Signed)
[facility] DEPT Provider Note   CSN: [id number] Arrival date & time: 11/26/21  1107     History  Chief Complaint  Patient presents with   Chest Pain    Stacey Nguyen is a 21 y.o. female.  Patient with history of smoking, positive COVID test at the end of December 2022 --presents to the emergency department for evaluation of chest pain.  Patient states that she felt poorly with COVID, having a cough and other symptoms for about 8 days.  This has improved however she continues to have bilateral chest pain, worse with deep breathing and shortness of breath with exertion since that time.  Patient denies risk factors for pulmonary embolism including: unilateral leg swelling, history of DVT/PE/other blood clots, use of exogenous hormones, recent immobilizations, recent surgery, recent travel (>4hr segment), malignancy, hemoptysis.  Sometimes she will have a pain in her right leg, however it is not located in 1 particular area, and is not present currently.  No history of blood clots.  During previous ED visit, she was also diagnosed with UTI and given 1 dose of fosfomycin in the emergency department.  She states that she continues to have dysuria and increased frequency.  No fevers, vomiting or flank pain.  No vaginal bleeding and she denies concern for STI.        Home Medications Prior to Admission medications   Not on File      Allergies    Patient has no known allergies.    Review of Systems   Review of Systems  Physical Exam Updated Vital Signs BP 103/78 (BP Location: Left Arm)    Pulse 79    Temp (!) 97.5 F (36.4 C) (Oral)    Resp 16    LMP 11/16/2021    SpO2 100%  Physical Exam Vitals and nursing note reviewed.  Constitutional:      Appearance: She is well-developed. She is not diaphoretic.  HENT:     Head: Normocephalic and atraumatic.     Mouth/Throat:     Mouth: Mucous membranes are not dry.  Eyes:     Conjunctiva/sclera:  Conjunctivae normal.  Neck:     Vascular: Normal carotid pulses. No JVD.     Trachea: Trachea normal. No tracheal deviation.  Cardiovascular:     Rate and Rhythm: Normal rate and regular rhythm.     Pulses: No decreased pulses.          Radial pulses are 2+ on the right side and 2+ on the left side.     Heart sounds: Normal heart sounds, S1 normal and S2 normal. No murmur heard. Pulmonary:     Effort: Pulmonary effort is normal. No respiratory distress.     Breath sounds: No wheezing.  Chest:     Chest wall: No tenderness.  Abdominal:     General: Bowel sounds are normal.     Palpations: Abdomen is soft.     Tenderness: There is no abdominal tenderness. There is no guarding or rebound.  Musculoskeletal:        General: Normal range of motion.     Cervical back: Normal range of motion and neck supple. No muscular tenderness.     Right lower leg: No tenderness. No edema.     Left lower leg: No tenderness. No edema.  Skin:    General: Skin is warm and dry.     Coloration: Skin is not pale.  Neurological:     Mental Status: She  is alert.    ED Results / Procedures / Treatments   Labs (all labs ordered are listed, but only abnormal results are displayed) Labs Reviewed  BASIC METABOLIC PANEL - Abnormal; Notable for the following components:      Result Value   Glucose, Bld 132 (*)    All other components within normal limits  URINALYSIS, ROUTINE W REFLEX MICROSCOPIC - Abnormal; Notable for the following components:   APPearance CLOUDY (*)    Ketones, ur 5 (*)    Leukocytes,Ua LARGE (*)    Bacteria, UA RARE (*)    All other components within normal limits  CBC WITH DIFFERENTIAL/PLATELET  D-DIMER, QUANTITATIVE  TROPONIN I (HIGH SENSITIVITY)    ED ECG REPORT   Date: 11/26/2021  Rate: 91  Rhythm: normal sinus rhythm  QRS Axis: normal  Intervals: PR shortened  ST/T Wave abnormalities: normal  Conduction Disutrbances:none  Narrative Interpretation: Inferior Q waves  noted, agree with right atrial enlargement  Old EKG Reviewed: none available  I have personally reviewed the EKG tracing and disagree with the computerized printout as noted.  Radiology DG Chest 2 View  Result Date: 11/26/2021 CLINICAL DATA:  Right-sided chest pain for 2 weeks. Recent COVID infection. EXAM: CHEST - 2 VIEW COMPARISON:  11/14/2021 FINDINGS: The heart size and mediastinal contours are within normal limits. Both lungs are clear. The visualized skeletal structures are unremarkable. IMPRESSION: Normal exam. Electronically Signed   By: Danae OrleansJohn A Stahl M.D.   On: 11/26/2021 12:22    Procedures Procedures    Medications Ordered in ED Medications - No data to display  ED Course/ Medical Decision Making/ A&P    Patient seen and examined. History obtained directly from patient. Work-up including labs, imaging, EKG ordered in triage, if performed, were reviewed.    Labs/EKG: Independently reviewed and interpreted.  This included: EKG interpretation as above, CBC and BMP demonstrated mild hyperglycemia, normal troponin.  Imaging: Independently reviewed and interpreted.  This included: Chest x-ray, agree appears negative.  Medications/Fluids: None  Most recent vital signs reviewed and are as follows: BP 103/78 (BP Location: Left Arm)    Pulse 79    Temp (!) 97.5 F (36.4 C) (Oral)    Resp 16    LMP 11/16/2021    SpO2 100%   Initial impression: Pleuritic chest pain after recent COVID infection, also likely UTI.  Given pain and shortness of breath, although vital signs are reassuring, will screen for potential blood clot with D-dimer.  Discussed this with patient and she agrees.  Reassessment performed. Patient appears comfortable.   Labs and imaging personally reviewed and interpreted including: Normal D-dimer.  Reviewed additional pertinent lab work and imaging with patient at bedside including: Normal D-dimer.  Most current vital signs reviewed and are as follows: BP 101/73     Pulse 64    Temp 98.5 F (36.9 C) (Oral)    Resp 16    LMP 11/16/2021    SpO2 99%   Plan: Discharged home  Home treatment: Prescription written for naproxen for pain, Keflex for suspected UTI  Return and follow-up instructions: Encouraged return to ED with worsening shortness of breath, trouble breathing, flank pain or fever.  Encouraged patient to follow-up with their provider in 5 days if symptoms not improving. Patient verbalized understanding and agreed with plan.                             Medical Decision Making  Patient with chest pain after recent COVID infection.  Cardiac work-up largely negative.  Patient does have EKG with right atrial enlargement and Q waves.  Encouraged recheck by PCP.  Likely unrelated to current symptoms.  D-dimer sent due to recent COVID infection and was normal.  Low concern for PE.  Patient also has irritative UTI symptoms and UA consistent with UTI.  Regards to chest pain, considered pleuritic pain, musculoskeletal pain, esophagitis, ACS, PE, pericarditis/myocarditis on differential.        Final Clinical Impression(s) / ED Diagnoses Final diagnoses:  Nonspecific chest pain  Acute cystitis without hematuria    Rx / DC Orders ED Discharge Orders          Ordered    cephALEXin (KEFLEX) 500 MG capsule  4 times daily        11/26/21 1635    naproxen (NAPROSYN) 500 MG tablet  2 times daily        11/26/21 1635              Carlisle Cater, PA-C 11/26/21 2330    Tegeler, Gwenyth Allegra, MD 11/26/21 [phone]

## 2021-11-26 NOTE — ED Provider Triage Note (Signed)
Emergency Medicine Provider Triage Evaluation Note  Stacey Nguyen , a 21 y.o. female  was evaluated in triage.  Pt complains of recent COVID infection at the end of December and since resolution of her COVID symptoms she has been experiencing fleeting chest pain, shortness of breath and recurrence of dysuria and increased vaginal discharge.  She reports she was recently treated for UTI prior to COVID.  She denies fever, chills, nausea, vomiting, lightheadedness, or palpitations..  Review of Systems  Positive: As above Negative: As above  Physical Exam  BP 122/78 (BP Location: Right Arm)    Pulse 77    Temp (!) 97.5 F (36.4 C) (Oral)    Resp 16    LMP 11/16/2021    SpO2 99%  Gen:   Awake, no distress   Resp:  Normal effort  MSK:   Moves extremities without difficulty  Other:   Medical Decision Making  Medically screening exam initiated at 11:37 AM.  Appropriate orders placed.  Psalm Addilynn Mowrer was informed that the remainder of the evaluation will be completed by another provider, this initial triage assessment does not replace that evaluation, and the importance of remaining in the ED until their evaluation is complete.     Marita Kansas, PA-C 11/26/21 1139

## 2021-12-06 ENCOUNTER — Encounter (HOSPITAL_COMMUNITY): Payer: Self-pay

## 2021-12-06 ENCOUNTER — Other Ambulatory Visit: Payer: Self-pay

## 2021-12-06 ENCOUNTER — Emergency Department (HOSPITAL_COMMUNITY)
Admission: EM | Admit: 2021-12-06 | Discharge: 2021-12-06 | Disposition: A | Discharge status: Home / Self Care | Payer: Medicaid Other | Attending: Emergency Medicine | Admitting: Emergency Medicine

## 2021-12-06 DIAGNOSIS — Z131 Encounter for screening for diabetes mellitus: Secondary | ICD-10-CM | POA: Insufficient documentation

## 2021-12-06 DIAGNOSIS — Z5321 Procedure and treatment not carried out due to patient leaving prior to being seen by health care provider: Secondary | ICD-10-CM | POA: Diagnosis not present

## 2021-12-06 NOTE — ED Triage Notes (Signed)
Says she can not eat spicy food or sweet food anymore. Wants testing to know why.

## 2021-12-06 NOTE — ED Notes (Signed)
Called to triage, no answer

## 2021-12-09 ENCOUNTER — Emergency Department (HOSPITAL_COMMUNITY)
Admission: EM | Admit: 2021-12-09 | Discharge: 2021-12-09 | Disposition: A | Discharge status: Home / Self Care | Payer: Medicaid Other | Attending: Emergency Medicine | Admitting: Emergency Medicine

## 2021-12-09 ENCOUNTER — Encounter (HOSPITAL_COMMUNITY): Payer: Self-pay

## 2021-12-09 ENCOUNTER — Other Ambulatory Visit: Payer: Self-pay

## 2021-12-09 DIAGNOSIS — N9489 Other specified conditions associated with female genital organs and menstrual cycle: Secondary | ICD-10-CM | POA: Insufficient documentation

## 2021-12-09 DIAGNOSIS — L853 Xerosis cutis: Secondary | ICD-10-CM | POA: Diagnosis not present

## 2021-12-09 DIAGNOSIS — R110 Nausea: Secondary | ICD-10-CM | POA: Insufficient documentation

## 2021-12-09 LAB — CBC WITH DIFFERENTIAL/PLATELET
Abs Immature Granulocytes: 0.01 10*3/uL (ref 0.00–0.07)
Basophils Absolute: 0.1 10*3/uL (ref 0.0–0.1)
Basophils Relative: 1 %
Eosinophils Absolute: 0.1 10*3/uL (ref 0.0–0.5)
Eosinophils Relative: 3 %
HCT: 41.7 % (ref 36.0–46.0)
Hemoglobin: 13.4 g/dL (ref 12.0–15.0)
Immature Granulocytes: 0 %
Lymphocytes Relative: 45 %
Lymphs Abs: 1.8 10*3/uL (ref 0.7–4.0)
MCH: 28.5 pg (ref 26.0–34.0)
MCHC: 32.1 g/dL (ref 30.0–36.0)
MCV: 88.7 fL (ref 80.0–100.0)
Monocytes Absolute: 0.4 10*3/uL (ref 0.1–1.0)
Monocytes Relative: 9 %
Neutro Abs: 1.7 10*3/uL (ref 1.7–7.7)
Neutrophils Relative %: 42 %
Platelets: 272 10*3/uL (ref 150–400)
RBC: 4.7 MIL/uL (ref 3.87–5.11)
RDW: 12 % (ref 11.5–15.5)
WBC: 4.1 10*3/uL (ref 4.0–10.5)
nRBC: 0 % (ref 0.0–0.2)

## 2021-12-09 LAB — COMPREHENSIVE METABOLIC PANEL
ALT: 18 U/L (ref 0–44)
AST: 20 U/L (ref 15–41)
Albumin: 4.8 g/dL (ref 3.5–5.0)
Alkaline Phosphatase: 54 U/L (ref 38–126)
Anion gap: 9 (ref 5–15)
BUN: 7 mg/dL (ref 6–20)
CO2: 23 mmol/L (ref 22–32)
Calcium: 9.6 mg/dL (ref 8.9–10.3)
Chloride: 102 mmol/L (ref 98–111)
Creatinine, Ser: 0.88 mg/dL (ref 0.44–1.00)
GFR, Estimated: >60 mL/min (ref >60)
Glucose, Bld: 86 mg/dL (ref 70–99)
Potassium: 3.6 mmol/L (ref 3.5–5.1)
Sodium: 134 mmol/L — ABNORMAL LOW (ref 135–145)
Total Bilirubin: 0.6 mg/dL (ref 0.3–1.2)
Total Protein: 8 g/dL (ref 6.5–8.1)

## 2021-12-09 LAB — I-STAT BETA HCG BLOOD, ED (MC, WL, AP ONLY): I-stat hCG, quantitative: <5 m[IU]/mL (ref <5)

## 2021-12-09 LAB — LIPASE, BLOOD: Lipase: 24 U/L (ref 11–51)

## 2021-12-09 NOTE — ED Provider Notes (Signed)
[facility] DEPT Provider Note   CSN: [id number] Arrival date & time: 12/09/21  1732     History  Chief Complaint  Patient presents with   Allergic Reaction    Stacey Nguyen is a 21 y.o. female who presents today for evaluation of nausea, burping. She states that since she finished Keflex a week ago she has been nauseated.  She reports she is having difficulty eating anything but notes that that originally started in December.  She states she has only been able to tolerate granola, water, and bananas. She states that she has abdominal pain when she eats. She feels like her UTI has cleared up. She also is concerned about dry skin that she has noticed all over her body.  She noticed that recently. She tells me that she is also concerned that she may have diabetes and wants that checked stating that her grandmother had diabetes.  She is unable to tell me hold her grandmother was when her mother developed diabetes.  HPI     Home Medications Prior to Admission medications   Medication Sig Start Date End Date Taking? Authorizing Provider  cephALEXin (KEFLEX) 500 MG capsule Take 1 capsule (500 mg total) by mouth 4 (four) times daily. 11/26/21   Carlisle Cater, PA-C  naproxen (NAPROSYN) 500 MG tablet Take 1 tablet (500 mg total) by mouth 2 (two) times daily. 11/26/21   Carlisle Cater, PA-C      Allergies    Patient has no known allergies.    Review of Systems   Review of Systems See hpi Physical Exam Updated Vital Signs BP 135/89    Pulse 88    Temp 98.6 F (37 C) (Oral)    Resp 16    Ht 5\' 3"  (1.6 m)    Wt 56.7 kg    LMP 11/16/2021    SpO2 98%    BMI 22.14 kg/m  Physical Exam Vitals and nursing note reviewed.  Constitutional:      General: She is not in acute distress.    Appearance: She is not ill-appearing.  HENT:     Head: Normocephalic and atraumatic.  Eyes:     Conjunctiva/sclera: Conjunctivae normal.  Cardiovascular:     Rate  and Rhythm: Normal rate and regular rhythm.     Heart sounds: Normal heart sounds.  Pulmonary:     Effort: Pulmonary effort is normal. No respiratory distress.     Breath sounds: Normal breath sounds.  Abdominal:     General: There is no distension.     Tenderness: There is no abdominal tenderness. There is no guarding.  Musculoskeletal:     Cervical back: Normal range of motion and neck supple.     Comments: No obvious acute injury  Skin:    General: Skin is warm and dry.     Comments: Dry skin on abdomen, bilateral arms, leg, and back.  Neurological:     Mental Status: She is alert.     Comments: Awake and alert, answers all questions appropriately.  Speech is not slurred.  Psychiatric:        Mood and Affect: Mood normal.        Behavior: Behavior normal.    ED Results / Procedures / Treatments   Labs (all labs ordered are listed, but only abnormal results are displayed) Labs Reviewed  COMPREHENSIVE METABOLIC PANEL - Abnormal; Notable for the following components:      Result Value   Sodium 134 (*)  All other components within normal limits  LIPASE, BLOOD  CBC WITH DIFFERENTIAL/PLATELET  I-STAT BETA HCG BLOOD, ED ([facility], [facility], [facility] ONLY)    EKG None  Radiology No results found.  Procedures Procedures    Medications Ordered in ED Medications - No data to display  ED Course/ Medical Decision Making/ A&P                           Medical Decision Making Amount and/or Complexity of Data Reviewed Labs: ordered.   Patient presents today for evaluation of multiple complaints. She is concerned she may be having allergic reaction due to frequent burping since reflux.  She finished with Keflex for UTI a week ago.  She feels like this is worse at night.  She questions if she may have heartburn.  She also is concerned about her dry skin and wonders if that may be an allergic reaction.  Her dry skin appears to be a simple uncomplicated dry skin, not on allergic  reaction. Regarding her burping and nausea she states that her symptoms are worse after eating and she does have abdominal pain after eating. Will check basic labs.   There were no pertinent outside records available for me to review on my attempt. No family at bedside to obtain collateral information from. Ultrasound is considered, however given her lab results and her abdomen not being tender is not indicated.  CBC, CMP, lipase are all reassuring without significant abnormalities. Pregnancy test is negative.  Patient is reassured. We discussed conservative care for dry skin, and PCP follow-up. Recommended using Prilosec for possible heartburn, along with heartburn diet given to patient.  This does not appear to be an allergic reaction.  Return precautions were discussed with patient who states their understanding.  At the time of discharge patient denied any unaddressed complaints or concerns.  Patient is agreeable for discharge home.  Note: Portions of this report may have been transcribed using voice recognition software. Every effort was made to ensure accuracy; however, inadvertent computerized transcription errors may be present     Final Clinical Impression(s) / ED Diagnoses Final diagnoses:  Nausea  Dry skin    Rx / DC Orders ED Discharge Orders     None         Ollen Gross 12/09/21 2211    Sherwood Gambler, MD 12/09/21 2318

## 2021-12-09 NOTE — Discharge Instructions (Addendum)
As we discussed today your skin concern appears to be dry skin. I would recommend limiting the duration of hot showers and taking lukewarm showers instead.  When you get out of the shower please, within the first few minutes, put on lotion as it sinks in the best when you were just out of the shower.  I have given you some information to read about food choices for heartburn. Please also get Prilosec which is available over-the-counter. To take this take it first thing in the morning before you even feel hungry for it to work the best. I would recommend getting a primary care doctor.  If you have Medicaid or another similar insurance you can call them to ask where you can be seen.  Have also given you the information for the wellness clinic, please see patients even if they do not have insurance. If you develop fevers, or unable to tolerate food or drink, or have any new or concerning symptoms please seek additional medical care and evaluation.

## 2021-12-09 NOTE — ED Triage Notes (Signed)
Patient reports that she thinks she is having an allergic reaction to Keflex. Patient states she is having "a lot of burping" and "dry skin on my stomach."

## 2021-12-30 ENCOUNTER — Other Ambulatory Visit: Payer: Self-pay

## 2021-12-30 ENCOUNTER — Encounter (HOSPITAL_COMMUNITY): Payer: Self-pay | Admitting: Emergency Medicine

## 2021-12-30 ENCOUNTER — Emergency Department (HOSPITAL_COMMUNITY): Payer: Medicaid Other

## 2021-12-30 ENCOUNTER — Emergency Department (HOSPITAL_COMMUNITY)
Admission: EM | Admit: 2021-12-30 | Discharge: 2021-12-30 | Disposition: A | Discharge status: Home / Self Care | Payer: Medicaid Other | Attending: Emergency Medicine | Admitting: Emergency Medicine

## 2021-12-30 DIAGNOSIS — R079 Chest pain, unspecified: Secondary | ICD-10-CM | POA: Diagnosis not present

## 2021-12-30 DIAGNOSIS — R350 Frequency of micturition: Secondary | ICD-10-CM | POA: Diagnosis present

## 2021-12-30 DIAGNOSIS — N898 Other specified noninflammatory disorders of vagina: Secondary | ICD-10-CM | POA: Diagnosis not present

## 2021-12-30 DIAGNOSIS — N3090 Cystitis, unspecified without hematuria: Secondary | ICD-10-CM | POA: Diagnosis not present

## 2021-12-30 LAB — URINALYSIS, ROUTINE W REFLEX MICROSCOPIC
Bacteria, UA: NONE SEEN
Bilirubin Urine: NEGATIVE
Glucose, UA: NEGATIVE mg/dL
Hgb urine dipstick: NEGATIVE
Ketones, ur: 5 mg/dL — AB
Nitrite: NEGATIVE
Protein, ur: NEGATIVE mg/dL
Specific Gravity, Urine: 1.026 (ref 1.005–1.030)
pH: 7 (ref 5.0–8.0)

## 2021-12-30 LAB — CBC
HCT: 36.2 % (ref 36.0–46.0)
Hemoglobin: 11.3 g/dL — ABNORMAL LOW (ref 12.0–15.0)
MCH: 28.6 pg (ref 26.0–34.0)
MCHC: 31.2 g/dL (ref 30.0–36.0)
MCV: 91.6 fL (ref 80.0–100.0)
Platelets: 212 10*3/uL (ref 150–400)
RBC: 3.95 MIL/uL (ref 3.87–5.11)
RDW: 12.4 % (ref 11.5–15.5)
WBC: 7 10*3/uL (ref 4.0–10.5)
nRBC: 0 % (ref 0.0–0.2)

## 2021-12-30 LAB — BASIC METABOLIC PANEL
Anion gap: 6 (ref 5–15)
BUN: 10 mg/dL (ref 6–20)
CO2: 24 mmol/L (ref 22–32)
Calcium: 8.9 mg/dL (ref 8.9–10.3)
Chloride: 105 mmol/L (ref 98–111)
Creatinine, Ser: 0.83 mg/dL (ref 0.44–1.00)
GFR, Estimated: >60 mL/min (ref >60)
Glucose, Bld: 90 mg/dL (ref 70–99)
Potassium: 3.5 mmol/L (ref 3.5–5.1)
Sodium: 135 mmol/L (ref 135–145)

## 2021-12-30 LAB — WET PREP, GENITAL
Clue Cells Wet Prep HPF POC: NONE SEEN
Sperm: NONE SEEN
Trich, Wet Prep: NONE SEEN
WBC, Wet Prep HPF POC: <10 (ref <10)
Yeast Wet Prep HPF POC: NONE SEEN

## 2021-12-30 LAB — PREGNANCY, URINE: Preg Test, Ur: NEGATIVE

## 2021-12-30 LAB — TROPONIN I (HIGH SENSITIVITY): Troponin I (High Sensitivity): <2 ng/L (ref <18)

## 2021-12-30 MED ORDER — NITROFURANTOIN MONOHYD MACRO 100 MG PO CAPS: 100.0000 mg | ORAL_CAPSULE | Freq: Two times a day (BID) | ORAL | 0 refills | Status: DC

## 2021-12-30 MED ORDER — CEFTRIAXONE SODIUM 1 G IJ SOLR
500.0000 mg | Freq: Once | INTRAMUSCULAR | Status: AC
  Administered 2021-12-30: 500 mg via INTRAMUSCULAR
  Filled 2021-12-30: qty 10

## 2021-12-30 MED ORDER — LIDOCAINE HCL (PF) 1 % IJ SOLN
1.0000 mL | Freq: Once | INTRAMUSCULAR | Status: AC
  Administered 2021-12-30: 1 mL
  Filled 2021-12-30: qty 30

## 2021-12-30 MED ORDER — AZITHROMYCIN 250 MG PO TABS
1000.0000 mg | ORAL_TABLET | Freq: Once | ORAL | Status: AC
  Administered 2021-12-30: 1000 mg via ORAL
  Filled 2021-12-30: qty 4

## 2021-12-30 NOTE — ED Provider Notes (Signed)
[facility]-EMERGENCY DEPT Provider Note   CSN: [id number] Arrival date & time: 12/30/21  0500     History  Chief Complaint  Patient presents with   Urinary Tract Infection   Chest Pain    Stacey Nguyen is a 21 y.o. female.  HPI     21 year old female comes in a chief complaint of chest pain and urinary symptoms.  Patient's main complaint is urinary symptoms.  She is having foul smell with vaginal discharge and indicates some urinary frequency.  She denies any abdominal pain, back pain or burning with urination.  She has had cystitis in the past with urinary frequency.  She also has history of STI, indicates no recent high risk behavior.  Patient did not bring up chest pain at all during our encounter.   Home Medications Prior to Admission medications   Medication Sig Start Date End Date Taking? Authorizing Provider  nitrofurantoin, macrocrystal-monohydrate, (MACROBID) 100 MG capsule Take 1 capsule (100 mg total) by mouth 2 (two) times daily. 12/30/21  Yes Carlyle Mcelrath, MD  cephALEXin (KEFLEX) 500 MG capsule Take 1 capsule (500 mg total) by mouth 4 (four) times daily. 11/26/21   Renne Crigler, PA-C  naproxen (NAPROSYN) 500 MG tablet Take 1 tablet (500 mg total) by mouth 2 (two) times daily. 11/26/21   Renne Crigler, PA-C      Allergies    Patient has no known allergies.    Review of Systems   Review of Systems  Constitutional:  Positive for activity change.  Genitourinary:  Positive for vaginal discharge. Negative for pelvic pain.   Physical Exam Updated Vital Signs BP 121/71 (BP Location: Right Arm)    Pulse 69    Temp 98 F (36.7 C) (Oral)    Resp 17    Ht 5\' 3"  (1.6 m)    Wt 56.7 kg    SpO2 100%    BMI 22.14 kg/m  Physical Exam Vitals and nursing note reviewed.  Constitutional:      Appearance: She is well-developed.  HENT:     Head: Atraumatic.  Cardiovascular:     Rate and Rhythm: Normal rate.  Pulmonary:     Effort:  Pulmonary effort is normal.  Musculoskeletal:     Cervical back: Normal range of motion and neck supple.  Skin:    General: Skin is warm and dry.  Neurological:     Mental Status: She is alert and oriented to person, place, and time.    ED Results / Procedures / Treatments   Labs (all labs ordered are listed, but only abnormal results are displayed) Labs Reviewed  URINALYSIS, ROUTINE W REFLEX MICROSCOPIC - Abnormal; Notable for the following components:      Result Value   APPearance HAZY (*)    Ketones, ur 5 (*)    Leukocytes,Ua MODERATE (*)    All other components within normal limits  CBC - Abnormal; Notable for the following components:   Hemoglobin 11.3 (*)    All other components within normal limits  WET PREP, GENITAL  PREGNANCY, URINE  BASIC METABOLIC PANEL  GC/CHLAMYDIA PROBE AMP ([facility]) NOT AT [facility]  TROPONIN I (HIGH SENSITIVITY)  TROPONIN I (HIGH SENSITIVITY)    EKG EKG Interpretation  Date/Time:  Friday December 30 2021 06:40:38 EST Ventricular Rate:  72 PR Interval:  116 QRS Duration: 86 QT Interval:  380 QTC Calculation: 416 R Axis:   81 Text Interpretation: Normal sinus rhythm with sinus arrhythmia Normal ECG When compared  to prior,  similar appearance. No STEMI Confirmed by Theda Belfast ([id number]) on 12/30/2021 7:58:13 AM  Radiology DG Chest 2 View  Result Date: 12/30/2021 CLINICAL DATA:  21 year old female with acute chest pain this morning. EXAM: CHEST - 2 VIEW COMPARISON:  Chest radiographs 11/26/2021 and earlier. FINDINGS: Mild thoracolumbar scoliosis. Normal lung volumes and mediastinal contours. Visualized tracheal air column is within normal limits. Both lungs appear clear. No pneumothorax or pleural effusion. Negative visible bowel gas. IMPRESSION: Mild scoliosis otherwise negative chest. Electronically Signed   By: Odessa Fleming M.D.   On: 12/30/2021 07:24    Procedures Procedures    Medications Ordered in ED Medications  cefTRIAXone  (ROCEPHIN) injection 500 mg (has no administration in time range)  lidocaine (PF) (XYLOCAINE) 1 % injection 1 mL (has no administration in time range)  azithromycin (ZITHROMAX) tablet 1,000 mg (1,000 mg Oral Given 12/30/21 1505)    ED Course/ Medical Decision Making/ A&P Clinical Course as of 12/30/21 1510  Fri Dec 30, 2021  1510 Troponin I (High Sensitivity) EKG is not showing any acute changes.  Repeat troponin is not indicated. [AN]  1510 Wet prep, genital No evidence of BV or Trichomonas or yeast [AN]  1510 Glori Luis): MODERATE Moderate leukocyte, urinary frequency.  We will give Macrobid. [AN]    Clinical Course User Index [AN] Derwood Kaplan, MD                           Medical Decision Making Amount and/or Complexity of Data Reviewed Labs: ordered. Radiology: ordered.  Risk Prescription drug management.   21 y/o with cc of urinary frequency, vaginal discharge. No recent high risk sexual behavior, but she prefers being safe rather than sorry and get covered for [facility] and chlamydia.  IM ceftriaxone and azithromycin will be given.  Given the throughput constraints, of staff was able to put her in a hallway bed.  Not ideal to do a pelvic exam.  Patient has no pelvic pain, I do not think she has PID, we gave her the option of self swab versus waiting for a bed to open up for pelvic exam.  Patient agreed to self swab.  Wet prep is not showing any evidence of yeast infection or BV.  Patient will be discharged with Macrobid for suspected UTI.  Urine is positive for leukocytes.  Final Clinical Impression(s) / ED Diagnoses Final diagnoses:  Cystitis  Vaginal discharge    Rx / DC Orders ED Discharge Orders          Ordered    nitrofurantoin, macrocrystal-monohydrate, (MACROBID) 100 MG capsule  2 times daily        12/30/21 1456              Derwood Kaplan, MD 12/30/21 1510

## 2021-12-30 NOTE — ED Notes (Signed)
Patient is complaining of chest pain.

## 2021-12-30 NOTE — ED Triage Notes (Signed)
Patient states that she is having an unusual smell in vagina area. Patient states she thinks she has a uti or std.

## 2021-12-30 NOTE — Discharge Instructions (Signed)
Take the antibiotics prescribed for UTI. Follow-up with your primary care doctors if the symptoms persist.

## 2022-01-02 LAB — GC/CHLAMYDIA PROBE AMP (~~LOC~~) NOT AT ARMC
Chlamydia: NEGATIVE
Comment: NEGATIVE
Comment: NORMAL
Neisseria Gonorrhea: NEGATIVE

## 2022-03-02 ENCOUNTER — Encounter (HOSPITAL_COMMUNITY): Payer: Self-pay | Admitting: Emergency Medicine

## 2022-03-02 ENCOUNTER — Emergency Department (HOSPITAL_COMMUNITY)
Admission: EM | Admit: 2022-03-02 | Discharge: 2022-03-02 | Disposition: A | Discharge status: Home / Self Care | Payer: Medicaid Other | Attending: Emergency Medicine | Admitting: Emergency Medicine

## 2022-03-02 ENCOUNTER — Other Ambulatory Visit: Payer: Self-pay

## 2022-03-02 DIAGNOSIS — N898 Other specified noninflammatory disorders of vagina: Secondary | ICD-10-CM

## 2022-03-02 DIAGNOSIS — L03011 Cellulitis of right finger: Secondary | ICD-10-CM | POA: Diagnosis present

## 2022-03-02 MED ORDER — METRONIDAZOLE 500 MG PO TABS: 500.0000 mg | ORAL_TABLET | Freq: Two times a day (BID) | ORAL | 0 refills | Status: DC

## 2022-03-02 MED ORDER — ACETAMINOPHEN 325 MG PO TABS: 650.0000 mg | ORAL_TABLET | Freq: Once | ORAL | Status: DC

## 2022-03-02 MED ORDER — LIDOCAINE-EPINEPHRINE (PF) 2 %-1:200000 IJ SOLN
20.0000 mL | Freq: Once | INTRAMUSCULAR | Status: DC
  Filled 2022-03-02: qty 20

## 2022-03-02 MED ORDER — METRONIDAZOLE 500 MG PO TABS
500.0000 mg | ORAL_TABLET | Freq: Two times a day (BID) | ORAL | 0 refills | Status: DC
Start: 2022-03-02 — End: 2022-07-25

## 2022-03-02 NOTE — Discharge Instructions (Signed)
It was our pleasure to provide your ER care today - we hope that you feel better. ? ?Keep area very clean. Avoid biting nails.  ? ?Take acetaminophen or ibuprofen as need. ? ?Take antibiotic as prescribed.  ? ?Follow up with primary care doctor in one week if symptoms fail to improve/resolve. ? ?Return to ER if worse, new symptoms, fevers, abdominal/pelvic pain, or other concern.  ?

## 2022-03-02 NOTE — ED Notes (Signed)
Urine sent to lab if needed 

## 2022-03-02 NOTE — ED Provider Notes (Signed)
?[facility]-EMERGENCY DEPT ?Provider Note ? ? ?CSN: [id number] ?Arrival date & time: 03/02/22  1940 ? ?  ? ?History ? ?Chief Complaint  ?Patient presents with  ? Abscess  ? ? ?Stacey Nguyen is a 21 y.o. female. ? ?Patient c/o pain/swelling to right thumb along side of nail for past few days. States habit of biting nails. No drainage from area. No redness, pain or swelling proximal to nail area. No fever or chills. Also requests abx rx for bacterial vaginosis, says mild d/c w odor that is same as with prior bv - pt adds that she does not want pelvic exam, but only rx. No abd or pelvic pain. No fever or chills.  ? ?The history is provided by the patient and medical records.  ?Abscess ?Associated symptoms: no fever, no nausea and no vomiting   ? ?  ? ?Home Medications ?Prior to Admission medications   ?Medication Sig Start Date End Date Taking? Authorizing Provider  ?cephALEXin (KEFLEX) 500 MG capsule Take 1 capsule (500 mg total) by mouth 4 (four) times daily. 11/26/21   Renne Crigler, PA-C  ?naproxen (NAPROSYN) 500 MG tablet Take 1 tablet (500 mg total) by mouth 2 (two) times daily. 11/26/21   Renne Crigler, PA-C  ?nitrofurantoin, macrocrystal-monohydrate, (MACROBID) 100 MG capsule Take 1 capsule (100 mg total) by mouth 2 (two) times daily. 12/30/21   Derwood Kaplan, MD  ?   ? ?Allergies    ?Patient has no known allergies.   ? ?Review of Systems   ?Review of Systems  ?Constitutional:  Negative for fever.  ?Respiratory:  Negative for shortness of breath.   ?Gastrointestinal:  Negative for abdominal pain, nausea and vomiting.  ?Genitourinary:  Positive for vaginal discharge. Negative for flank pain and pelvic pain.  ?Musculoskeletal:  Negative for back pain.  ?Skin:  Negative for rash.  ? ?Physical Exam ?Updated Vital Signs ?BP 108/87 (BP Location: Left Arm)   Pulse 87   Temp 97.9 ?F (36.6 ?C) (Oral)   Resp 16   Ht 1.6 m (5\' 3" )   Wt 55.3 kg   LMP 02/10/2022   SpO2 100%   BMI 21.61  kg/m?  ?Physical Exam ?Vitals and nursing note reviewed.  ?Constitutional:   ?   Appearance: Normal appearance. She is well-developed.  ?HENT:  ?   Head: Atraumatic.  ?   Nose: Nose normal.  ?   Mouth/Throat:  ?   Mouth: Mucous membranes are moist.  ?Eyes:  ?   General: No scleral icterus. ?   Conjunctiva/sclera: Conjunctivae normal.  ?Neck:  ?   Trachea: No tracheal deviation.  ?Cardiovascular:  ?   Rate and Rhythm: Normal rate.  ?   Pulses: Normal pulses.  ?Pulmonary:  ?   Effort: Pulmonary effort is normal. No respiratory distress.  ?Abdominal:  ?   General: There is no distension.  ?   Palpations: Abdomen is soft.  ?   Tenderness: There is no abdominal tenderness.  ?Genitourinary: ?   Comments: No cva tenderness.  ?Musculoskeletal:  ?   Cervical back: Neck supple. No muscular tenderness.  ?   Comments: Paronychia right thumb. No proximal or deep space thumb infection, normal rom digit without pain. Normal cap refill distally.   ?Skin: ?   General: Skin is warm and dry.  ?   Findings: No rash.  ?Neurological:  ?   Mental Status: She is alert.  ?   Comments: Alert, speech normal.   ?Psychiatric:     ?  Mood and Affect: Mood normal.  ? ? ?ED Results / Procedures / Treatments   ?Labs ?(all labs ordered are listed, but only abnormal results are displayed) ?Labs Reviewed - No data to display ? ?EKG ?None ? ?Radiology ?No results found. ? ?Procedures ?Marland Kitchen.Incision and Drainage ? ?Date/Time: 03/02/2022 9:41 PM ?Performed by: Cathren Laine, MD ?Authorized by: Cathren Laine, MD  ? ?Consent:  ?  Consent given by:  Patient ?Location:  ?  Type:  Abscess (paronychia) ?  Location:  Upper extremity ?  Upper extremity location:  Finger ?  Finger location:  R thumb ?Pre-procedure details:  ?  Skin preparation:  Chlorhexidine with alcohol ?Anesthesia:  ?  Anesthesia method:  Nerve block ?  Block anesthetic:  Lidocaine 2% WITH epi ?  Block technique:  Digit block ?  Block outcome:  Anesthesia achieved ?Procedure type:  ?  Complexity:   Simple ?Procedure details:  ?  Incision type: along nail edge. ?  Incision depth:  Subcutaneous ?  Wound management:  Probed and deloculated and irrigated with saline ?  Drainage:  Purulent ?  Drainage amount: small. ?  Wound treatment:  Wound left open ?Post-procedure details:  ?  Procedure completion:  Tolerated well, no immediate complications  ? ? ?Medications Ordered in ED ?Medications  ?lidocaine-EPINEPHrine (XYLOCAINE W/EPI) 2 %-1:200000 (PF) injection 20 mL (has no administration in time range)  ? ? ?ED Course/ Medical Decision Making/ A&P ?  ?                        ?Medical Decision Making ?Problems Addressed: ?Paronychia of right thumb: acute illness or injury ?Vaginal discharge: acute illness or injury ? ?Amount and/or Complexity of Data Reviewed ?External Data Reviewed: notes. ? ?Risk ?OTC drugs. ?Prescription drug management. ? ? ?I and D paronychia. ? ?Reviewed nursing notes and prior charts for additional history.  ? ?Pt declines pelvic exam, requests abx rx.  ? ?Confirmed only abx allergy is keflex. Rx sent.  ? ?Acetaminophen po.  ? ?Sterile dressing placed. Pt tolerated very well. ? ?Post procedure, normal skin color, normal cap refill distally.  ? ? ? ? ? ? ? ? ? ?Final Clinical Impression(s) / ED Diagnoses ?Final diagnoses:  ?None  ? ? ?Rx / DC Orders ?ED Discharge Orders   ? ? None  ? ?  ? ? ?  ?Cathren Laine, MD ?03/02/22 2143 ? ?

## 2022-03-02 NOTE — ED Triage Notes (Signed)
Patient complaining of right thumb abscess from biting nail. Patient also wants medicine for bacterial vaginosis. ?

## 2022-05-29 ENCOUNTER — Emergency Department (HOSPITAL_COMMUNITY)
Admission: EM | Admit: 2022-05-29 | Discharge: 2022-05-30 | Disposition: A | Discharge status: Home / Self Care | Payer: Medicaid Other | Attending: Emergency Medicine | Admitting: Emergency Medicine

## 2022-05-29 ENCOUNTER — Other Ambulatory Visit: Payer: Self-pay

## 2022-05-29 ENCOUNTER — Encounter (HOSPITAL_COMMUNITY): Payer: Self-pay

## 2022-05-29 ENCOUNTER — Emergency Department (HOSPITAL_COMMUNITY): Payer: Medicaid Other

## 2022-05-29 DIAGNOSIS — A749 Chlamydial infection, unspecified: Secondary | ICD-10-CM | POA: Diagnosis not present

## 2022-05-29 DIAGNOSIS — R079 Chest pain, unspecified: Secondary | ICD-10-CM | POA: Insufficient documentation

## 2022-05-29 DIAGNOSIS — N898 Other specified noninflammatory disorders of vagina: Secondary | ICD-10-CM | POA: Diagnosis present

## 2022-05-29 DIAGNOSIS — F17200 Nicotine dependence, unspecified, uncomplicated: Secondary | ICD-10-CM | POA: Insufficient documentation

## 2022-05-29 DIAGNOSIS — N760 Acute vaginitis: Secondary | ICD-10-CM | POA: Insufficient documentation

## 2022-05-29 DIAGNOSIS — B9689 Other specified bacterial agents as the cause of diseases classified elsewhere: Secondary | ICD-10-CM | POA: Diagnosis not present

## 2022-05-29 LAB — URINALYSIS, ROUTINE W REFLEX MICROSCOPIC
Bilirubin Urine: NEGATIVE
Glucose, UA: NEGATIVE mg/dL
Hgb urine dipstick: NEGATIVE
Ketones, ur: 20 mg/dL — AB
Nitrite: NEGATIVE
Protein, ur: 30 mg/dL — AB
Specific Gravity, Urine: 1.027 (ref 1.005–1.030)
Squamous Epithelial / HPF: >50 — ABNORMAL HIGH (ref 0–5)
WBC, UA: >50 WBC/hpf — ABNORMAL HIGH (ref 0–5)
pH: 5 (ref 5.0–8.0)

## 2022-05-29 LAB — I-STAT BETA HCG BLOOD, ED (MC, WL, AP ONLY): I-stat hCG, quantitative: <5 m[IU]/mL (ref <5)

## 2022-05-29 LAB — CBC
HCT: 44.2 % (ref 36.0–46.0)
Hemoglobin: 13.9 g/dL (ref 12.0–15.0)
MCH: 28.5 pg (ref 26.0–34.0)
MCHC: 31.4 g/dL (ref 30.0–36.0)
MCV: 90.6 fL (ref 80.0–100.0)
Platelets: 286 10*3/uL (ref 150–400)
RBC: 4.88 MIL/uL (ref 3.87–5.11)
RDW: 13.7 % (ref 11.5–15.5)
WBC: 6.3 10*3/uL (ref 4.0–10.5)
nRBC: 0 % (ref 0.0–0.2)

## 2022-05-29 LAB — BASIC METABOLIC PANEL
Anion gap: 10 (ref 5–15)
BUN: 11 mg/dL (ref 6–20)
CO2: 23 mmol/L (ref 22–32)
Calcium: 10.1 mg/dL (ref 8.9–10.3)
Chloride: 108 mmol/L (ref 98–111)
Creatinine, Ser: 0.82 mg/dL (ref 0.44–1.00)
GFR, Estimated: >60 mL/min (ref >60)
Glucose, Bld: 82 mg/dL (ref 70–99)
Potassium: 3.5 mmol/L (ref 3.5–5.1)
Sodium: 141 mmol/L (ref 135–145)

## 2022-05-29 LAB — TROPONIN I (HIGH SENSITIVITY): Troponin I (High Sensitivity): <2 ng/L (ref <18)

## 2022-05-29 NOTE — ED Provider Triage Note (Signed)
Emergency Medicine Provider Triage Evaluation Note  Stacey Nguyen , a 21 y.o. female  was evaluated in triage.  Pt complains of left sided chest pain that started yesterday while getting out of the pool.  States that her pain got a little worse when she was try to go to sleep, she woke up and the pain persisted.  She had some pain with deep breathing, but that is resolved.  No history of cardiac problems.  Also complaining of vaginal discharge for the past week and a half.  When she had the chest pain yesterday, she had some lower abdominal pain, but states this is resolved.  Also complaining of some dysuria.  Tried boric acid tablets without relief.  Review of Systems  Positive: As above Negative: Syncope, cough, fever, nausea, vomiting, diarrhea, hematuria  Physical Exam  BP (!) 131/91 (BP Location: Right Arm)   Pulse 61   Temp 98.4 F (36.9 C) (Oral)   Resp 18   SpO2 99%  Gen:   Awake, no distress   Resp:  Normal effort  MSK:   Moves extremities without difficulty  Other:    Medical Decision Making  Medically screening exam initiated at 7:40 PM.  Appropriate orders placed.  Stacey Nguyen was informed that the remainder of the evaluation will be completed by another provider, this initial triage assessment does not replace that evaluation, and the importance of remaining in the ED until their evaluation is complete.     Su Monks, Cordelia Poche 05/29/22 1942

## 2022-05-29 NOTE — ED Triage Notes (Signed)
Pt states that she has left sided chest pain since leaving the pool yesterday. Pt reports wanting medication for BV. Pt is unsure if it is BV or an STD.

## 2022-05-30 ENCOUNTER — Encounter (HOSPITAL_COMMUNITY): Payer: Self-pay

## 2022-05-30 ENCOUNTER — Emergency Department (HOSPITAL_COMMUNITY)
Admission: EM | Admit: 2022-05-30 | Discharge: 2022-05-30 | Disposition: A | Discharge status: Home / Self Care | Payer: Medicaid Other | Source: Home / Self Care | Attending: Emergency Medicine | Admitting: Emergency Medicine

## 2022-05-30 ENCOUNTER — Other Ambulatory Visit: Payer: Self-pay

## 2022-05-30 DIAGNOSIS — B9689 Other specified bacterial agents as the cause of diseases classified elsewhere: Secondary | ICD-10-CM | POA: Insufficient documentation

## 2022-05-30 DIAGNOSIS — A749 Chlamydial infection, unspecified: Secondary | ICD-10-CM | POA: Insufficient documentation

## 2022-05-30 DIAGNOSIS — A64 Unspecified sexually transmitted disease: Secondary | ICD-10-CM | POA: Insufficient documentation

## 2022-05-30 DIAGNOSIS — N760 Acute vaginitis: Secondary | ICD-10-CM | POA: Insufficient documentation

## 2022-05-30 LAB — GC/CHLAMYDIA PROBE AMP (~~LOC~~) NOT AT ARMC
Chlamydia: POSITIVE — AB
Comment: NEGATIVE
Comment: NORMAL
Neisseria Gonorrhea: NEGATIVE

## 2022-05-30 LAB — WET PREP, GENITAL
Sperm: NONE SEEN
Trich, Wet Prep: NONE SEEN
WBC, Wet Prep HPF POC: <10 (ref <10)
Yeast Wet Prep HPF POC: NONE SEEN

## 2022-05-30 LAB — TROPONIN I (HIGH SENSITIVITY): Troponin I (High Sensitivity): <2 ng/L (ref <18)

## 2022-05-30 MED ORDER — AZITHROMYCIN 250 MG PO TABS
1000.0000 mg | ORAL_TABLET | Freq: Once | ORAL | Status: AC
  Administered 2022-05-30: 1000 mg via ORAL
  Filled 2022-05-30: qty 4

## 2022-05-30 MED ORDER — METRONIDAZOLE 0.75 % VA GEL: 1.0000 | Freq: Every day | VAGINAL | 0 refills | Status: AC

## 2022-05-30 NOTE — ED Provider Notes (Signed)
[facility]-EMERGENCY DEPT Provider Note   CSN: [id number] Arrival date & time: 05/29/22  1751     History  Chief Complaint  Patient presents with   Chest Pain   Vaginal Discharge    Stacey Nguyen is a 21 y.o. female with a hx of tobacco use who presents to the ED with complaints of chest pain that began yesterday- improved @ present, and vaginal discharge x 1-2 weeks.   Patient reports she was swimming yesterday with onset of left sided chest pain. Pain w/ certain movements and at times with deep breaths, alleviated by remaining still. Patient reports this has progressively improved and is no longer of significant concern to her, states she is really here for her vaginal discharge. Reports some associated vaginal irritation, minimal dysuria, no alleviating/aggravating factors. Pelvis hurt while swimming yesterday but has otherwise had no pelvic/abdominal pain. Denies fever ,chills, N/v, vaginal bleeding, frequency, or urgency. She is sexually active in a monogamous relationship without known STD exposure. Additionally patient denies leg pain/swelling, hemoptysis, recent surgery/trauma, recent long travel, hormone use, personal hx of cancer, or hx of DVT/PE.     HPI     Home Medications Prior to Admission medications   Medication Sig Start Date End Date Taking? Authorizing Provider  metroNIDAZOLE (FLAGYL) 500 MG tablet Take 1 tablet (500 mg total) by mouth 2 (two) times daily. 03/02/22   Cathren Laine, MD  naproxen (NAPROSYN) 500 MG tablet Take 1 tablet (500 mg total) by mouth 2 (two) times daily. 11/26/21   Renne Crigler, PA-C      Allergies    Patient has no known allergies.    Review of Systems   Review of Systems  Constitutional:  Negative for chills and fever.  Respiratory:  Negative for shortness of breath.   Cardiovascular:  Positive for chest pain. Negative for leg swelling.  Gastrointestinal:  Negative for diarrhea, nausea and vomiting.   Genitourinary:  Positive for dysuria and vaginal discharge. Negative for frequency, urgency and vaginal bleeding.  Neurological:  Negative for syncope.  All other systems reviewed and are negative.   Physical Exam Updated Vital Signs BP 117/78   Pulse 63   Temp 98.1 F (36.7 C) (Oral)   Resp 18   SpO2 100%  Physical Exam Vitals and nursing note reviewed. Exam conducted with a chaperone present.  Constitutional:      General: She is not in acute distress.    Appearance: She is well-developed. She is not toxic-appearing.  HENT:     Head: Normocephalic and atraumatic.  Eyes:     General:        Right eye: No discharge.        Left eye: No discharge.     Conjunctiva/sclera: Conjunctivae normal.  Cardiovascular:     Rate and Rhythm: Normal rate and regular rhythm.  Pulmonary:     Effort: No respiratory distress.     Breath sounds: Normal breath sounds. No wheezing or rales.  Chest:     Chest wall: Tenderness (left anterior chest wall) present.  Abdominal:     General: There is no distension.     Palpations: Abdomen is soft.     Tenderness: There is no abdominal tenderness. There is no guarding or rebound.  Genitourinary:    Comments: No external lesions.  Mild white vaginal discharge. No purulence. No CMT or adnexal TTP.  Musculoskeletal:     Cervical back: Neck supple.     Right lower leg: No  tenderness. No edema.     Left lower leg: No tenderness. No edema.  Skin:    General: Skin is warm and dry.  Neurological:     Mental Status: She is alert.     Comments: Clear speech.   Psychiatric:        Behavior: Behavior normal.     ED Results / Procedures / Treatments   Labs (all labs ordered are listed, but only abnormal results are displayed) Labs Reviewed  WET PREP, GENITAL - Abnormal; Notable for the following components:      Result Value   Clue Cells Wet Prep HPF POC PRESENT (*)    All other components within normal limits  URINALYSIS, ROUTINE W REFLEX  MICROSCOPIC - Abnormal; Notable for the following components:   APPearance CLOUDY (*)    Ketones, ur 20 (*)    Protein, ur 30 (*)    Leukocytes,Ua LARGE (*)    WBC, UA >50 (*)    Bacteria, UA FEW (*)    Squamous Epithelial / LPF >50 (*)    All other components within normal limits  BASIC METABOLIC PANEL  CBC  I-STAT BETA HCG BLOOD, ED ([facility], [facility], [facility] ONLY)  GC/CHLAMYDIA PROBE AMP ([place]) NOT AT [facility]  TROPONIN I (HIGH SENSITIVITY)  TROPONIN I (HIGH SENSITIVITY)    EKG EKG Interpretation  Date/Time:  Monday May 29 2022 19:31:12 EDT Ventricular Rate:  66 PR Interval:  107 QRS Duration: 72 QT Interval:  411 QTC Calculation: 431 R Axis:   84 Text Interpretation: Sinus rhythm Atrial premature complex Short PR interval RAE, consider biatrial enlargement No significant change since last tracing Confirmed by Susy Frizzle [phone]) on 05/30/2022 1:33:27 AM  Radiology DG Chest 2 View  Result Date: 05/29/2022 CLINICAL DATA:  Left chest pain EXAM: CHEST - 2 VIEW COMPARISON:  None Available. FINDINGS: Lungs are well expanded, symmetric, and clear. No pneumothorax or pleural effusion. Cardiac size within normal limits. Pulmonary vascularity is normal. Mild thoracolumbar dextroscoliosis. No acute bone abnormality. IMPRESSION: Of acute cardiopulmonary disease.  No radiographic evidence Electronically Signed   By: Helyn Numbers M.D.   On: 05/29/2022 19:35    Procedures Procedures    Medications Ordered in ED Medications - No data to display  ED Course/ Medical Decision Making/ A&P                           Medical Decision Making Amount and/or Complexity of Data Reviewed Labs: ordered. Radiology: ordered.   Patient presents to the emergency department with chest pain and vaginal discharge. Patient nontoxic appearing, in no apparent distress, vitals without significant abnormality- initial mild BP elevation resolved.   Additional history obtained:  Chart & nursing note  reviewed.   EKG: No significant change since last tracing.   Lab Tests:  I reviewed & interpreted labs including:  CBC, BMP, troponins, pregnancy test, wet prep, urinalysis: Pregnancy test is negative, wet prep with clue cells indicating likely BV and symptomatic patient, urinalysis with finding concerning for infection however grossly contaminated therefore will be sent for culture.  Troponins are negative and flat.  Imaging Studies ordered:  I viewed the following imaging, agree with radiologist impression:  Chest x-ray: No radiographic evidence of acute cardiopulmonary disease.  ED Course:  Chest pain: Heart pathway score low risk, delta troponin negative, doubt ACS. Patient is low risk wells, PERC negative, doubt pulmonary embolism. Pain is not a tearing sensation, no widening of mediastinum  on CXR, doubt dissection.  Abdomen is nontender without peritoneal signs.  Chest x-ray without pneumothorax, pneumonia, or fluid overload.  Favor musculoskeletal pain given reproducible with certain movement/position changes and palpation.  Recommended ibuprofen.  Vaginal discharge: Pregnancy test is negative. Does have some dysuria too however few bacteria and significantly contaminated urine- will send for cx and hold off on abx for UTI in the setting of vaginal discharge. Wet prep w/ findings suspicious for BV- discussed tx options- patient elected for inravaginal metronidazole. Gc/chlamydia pending, no CMT/adnexal TTP to raise concern for PID.   I discussed results, treatment plan, need for PCP follow-up, and return precautions with the patient. Provided opportunity for questions, patient confirmed understanding and is in agreement with plan.   Portions of this note were generated with Scientist, clinical (histocompatibility and immunogenetics). Dictation errors may occur despite best attempts at proofreading.   Final Clinical Impression(s) / ED Diagnoses Final diagnoses:  BV (bacterial vaginosis)  Chest pain, unspecified type     Rx / DC Orders ED Discharge Orders          Ordered    metroNIDAZOLE (METROGEL VAGINAL) 0.75 % vaginal gel  Daily at bedtime        05/30/22 0129              Malachai Schalk, Pleas Koch, PA-C 05/30/22 0136    Pollyann Savoy, MD 05/30/22 0157

## 2022-05-30 NOTE — Discharge Instructions (Signed)
You were treated for STD here. Let your partner know so they may be treated as well

## 2022-05-30 NOTE — Discharge Instructions (Addendum)
You were seen in the emergency department today for vaginal discharge and chest pain.  Your work-up was overall reassuring.  Your wet prep showed findings of bacterial vaginosis which we are treating with metronidazole gel, please use 1 application nightly for the next 5 days.  If your gonorrhea or chlamydia test returned positive we will call you.  If your urine culture shows a UTI we will call you.  Please take ibuprofen as needed for your chest pain.  Follow-up with primary care and women's soon as possible.  Return to the ER for new or worsening symptoms or any other concerns.

## 2022-05-30 NOTE — ED Provider Notes (Signed)
[facility]   CSN: [id number] Arrival date & time: 05/30/22  2006     History  Chief Complaint  Patient presents with   SEXUALLY TRANSMITTED DISEASE    Jon Gills Antionetta Ator is a 21 y.o. female for evaluation of positive STD screen.  Was seen yesterday for discharge over the last 2 weeks.  Was found to have BV.  She has no pain, dysuria or hematuria.  Checked her MyChart today was positive for chlamydia. Sexually active with 1 female partner. HPI     Home Medications Prior to Admission medications   Medication Sig Start Date End Date Taking? Authorizing Provider  metroNIDAZOLE (FLAGYL) 500 MG tablet Take 1 tablet (500 mg total) by mouth 2 (two) times daily. 03/02/22   Cathren Laine, MD  metroNIDAZOLE (METROGEL VAGINAL) 0.75 % vaginal gel Place 1 Applicatorful vaginally at bedtime for 5 days. 05/30/22 06/04/22  Petrucelli, Samantha R, PA-C  naproxen (NAPROSYN) 500 MG tablet Take 1 tablet (500 mg total) by mouth 2 (two) times daily. 11/26/21   Renne Crigler, PA-C      Allergies    Patient has no known allergies.    Review of Systems   Review of Systems  Constitutional: Negative.   HENT: Negative.    Respiratory: Negative.    Cardiovascular: Negative.   Gastrointestinal: Negative.   Genitourinary:  Positive for vaginal discharge.  Musculoskeletal: Negative.   Skin: Negative.   Neurological: Negative.   All other systems reviewed and are negative.   Physical Exam Updated Vital Signs BP (!) 141/95 (BP Location: Right Arm)   Pulse 62   Temp 98.5 F (36.9 C) (Oral)   Resp 17   Ht 5\' 3"  (1.6 m)   Wt 54.4 kg   SpO2 100%   BMI 21.26 kg/m  Physical Exam Vitals and nursing note reviewed.  Constitutional:      General: She is not in acute distress.    Appearance: She is well-developed. She is not ill-appearing, toxic-appearing or diaphoretic.  HENT:     Head: Atraumatic.  Eyes:     Pupils: Pupils are equal, round, and  reactive to light.  Cardiovascular:     Rate and Rhythm: Normal rate.  Pulmonary:     Effort: No respiratory distress.  Abdominal:     General: There is no distension.  Genitourinary:    Comments: Declined, performed yesterday during exam Musculoskeletal:        General: Normal range of motion.     Cervical back: Normal range of motion.  Skin:    General: Skin is warm and dry.  Neurological:     General: No focal deficit present.     Mental Status: She is alert.  Psychiatric:        Mood and Affect: Mood normal.     ED Results / Procedures / Treatments   Labs (all labs ordered are listed, but only abnormal results are displayed) Labs Reviewed - No data to display  EKG None  Radiology DG Chest 2 View  Result Date: 05/29/2022 CLINICAL DATA:  Left chest pain EXAM: CHEST - 2 VIEW COMPARISON:  None Available. FINDINGS: Lungs are well expanded, symmetric, and clear. No pneumothorax or pleural effusion. Cardiac size within normal limits. Pulmonary vascularity is normal. Mild thoracolumbar dextroscoliosis. No acute bone abnormality. IMPRESSION: Of acute cardiopulmonary disease.  No radiographic evidence Electronically Signed   By: 05/31/2022 M.D.   On: 05/29/2022 19:35    Procedures Procedures  Medications Ordered in ED Medications  azithromycin (ZITHROMAX) tablet 1,000 mg (1,000 mg Oral Given 05/30/22 2041)    ED Course/ Medical Decision Making/ A&P    21 year old here for evaluation of positive STD testing.  Was seen here in ED yesterday for vaginal discharge over the last 2 weeks.  Was found to have bacterial vaginosis.  She checked her MyChart today which showed she was positive for chlamydia.  She denies any pain, dysuria or hematuria.  No fevers.  Abdomen soft, nontender.  No systemic symptoms.  Personally viewed and interpreted her labs from yesterday which do show positive chlamydia.  We will treat with medication here.  She understands to let her partners know so  they may be treated as well.  Discussed no sexual activity for at least 1 week after treatment.  We will follow-up outpatient with health department or OB/GYN  The patient has been appropriately medically screened and/or stabilized in the ED. I have low suspicion for any other emergent medical condition which would require further screening, evaluation or treatment in the ED or require inpatient management.  Patient is hemodynamically stable and in no acute distress.  Patient able to ambulate in department prior to ED.  Evaluation does not show acute pathology that would require ongoing or additional emergent interventions while in the emergency department or further inpatient treatment.  I have discussed the diagnosis with the patient and answered all questions.  Pain is been managed while in the emergency department and patient has no further complaints prior to discharge.  Patient is comfortable with plan discussed in room and is stable for discharge at this time.  I have discussed strict return precautions for returning to the emergency department.  Patient was encouraged to follow-up with PCP/specialist refer to at discharge.                            Medical Decision Making Amount and/or Complexity of Data Reviewed External Data Reviewed: labs. Labs:  Decision-making details documented in ED Course.  Risk OTC drugs. Prescription drug management. Diagnosis or treatment significantly limited by social determinants of health.           Final Clinical Impression(s) / ED Diagnoses Final diagnoses:  Chlamydia    Rx / DC Orders ED Discharge Orders     None         Airlie Blumenberg A, PA-C 05/30/22 2321    Ernie Avena, MD 05/31/22 0131

## 2022-05-30 NOTE — ED Triage Notes (Addendum)
Patient was here yesterday, they did a pelvic exam and she checked her My Chart today and it showed that she was positive for chlamydia. No pelvic pain or drainage at this time. Patient just wants medication and to go home.

## 2022-06-01 LAB — URINE CULTURE: Culture: >=100000 — AB

## 2022-06-02 ENCOUNTER — Telehealth: Payer: Self-pay | Admitting: *Deleted

## 2022-06-02 NOTE — Progress Notes (Signed)
ED Antimicrobial Stewardship Positive Culture Follow Up   Stacey Nguyen is an 21 y.o. female who presented to [facility] on 05/30/2022 with a chief complaint of  Chief Complaint  Patient presents with   SEXUALLY TRANSMITTED DISEASE    Recent Results (from the past 720 hour(s))  Wet prep, genital     Status: Abnormal   Collection Time: 05/29/22 12:31 AM  Result Value Ref Range Status   Yeast Wet Prep HPF POC NONE SEEN NONE SEEN Final   Trich, Wet Prep NONE SEEN NONE SEEN Final   Clue Cells Wet Prep HPF POC PRESENT (A) NONE SEEN Final   WBC, Wet Prep HPF POC <10 <10 Final    Comment: Specimen diluted due to transport tube containing more than 1 ml of saline, interpret results with caution.   Sperm NONE SEEN  Final    Comment: Performed at [facility], [address], [place], [place] [address]  Urine Culture     Status: Abnormal   Collection Time: 05/29/22  7:41 PM   Specimen: Urine, Clean Catch  Result Value Ref Range Status   Specimen Description   Final    URINE, CLEAN CATCH Performed at [facility], [address], [place], [place] [address]    Special Requests   Final    NONE Performed at [facility], [address] [place], [place] [address]    Culture (A)  Final    >=100,000 COLONIES/mL LACTOBACILLUS SPECIES Standardized susceptibility testing for this organism is not available. Performed at [facility], [address], [place], [place] [address]    Report Status 06/01/2022 FINAL  Final    Patient appeared in the ED on 05/30/22 for evaluation s/p a positive screening for chlamydia. She denied pain, dysuria, and hematuria. Vaginal discharge noted. Patient concurrently has BV for which she is treated w/ metronidazole vaginal gel. Patient was treated in the ED w/ a 1000 mg dose of azithromycin and discharged w/out additional abx.   UA shows the following: Nitrite neg, leukocytes large, WBC >50, and  greater than 50 epithelial cells. Ucx grew >=100,000 colonies/mL of lactobacillus, a bacteria normal in vaginal flora.   Spoke to provider and it was agreed that since patient reported no UTI symptoms, has a Ucx that was likely contaminated, has already been treated for chlamydia and is undergoing BV treatment that additional abx are not needed.   ED Provider: Barrie Dunker   Zella Ball 06/02/2022, 8:16 AM PharmD Candidate  Monday - Friday phone -  [phone] Saturday - Sunday phone - [phone]

## 2022-06-02 NOTE — Telephone Encounter (Signed)
Post ED Visit - Positive Culture Follow-up  Culture report reviewed by antimicrobial stewardship pharmacist: [facility] []  , Pharm.D. []  Enzo Bi, .D., BCPS AQ-ID []  Celedonio Miyamoto, Pharm.D., BCPS [x]  [address], Pharm.D., BCPS []  [place], Garvin Fila.D., BCPS, AAHIVP []  , Pharm.D., BCPS, AAHIVP []  Georgina Pillion, PharmD, BCPS []  , PharmD, BCPS []  [place], PharmD, BCPS []  [place], PharmD []  , PharmD, BCPS []  Estella Husk, PharmD  Pharmacy Team []  Lysle Pearl, PharmD []  , PharmD []  Phillips Climes, PharmD []  , Rph []  Agapito Games) , PharmD []  Verlan Friends, PharmD []  , PharmD []  Mervyn Gay, PharmD []  , PharmD []  Vinnie Level, PharmD []  [facility], PharmD []  , PharmD []  Len Childs, PharmD   Positive urine culture Contaminant and no further patient follow-up is required at this time.  [facility] 06/02/2022, 8:34 AM

## 2022-06-03 ENCOUNTER — Emergency Department (HOSPITAL_COMMUNITY)
Admission: EM | Admit: 2022-06-03 | Discharge: 2022-06-03 | Disposition: A | Discharge status: Home / Self Care | Payer: Medicaid Other | Attending: Emergency Medicine | Admitting: Emergency Medicine

## 2022-06-03 ENCOUNTER — Encounter (HOSPITAL_COMMUNITY): Payer: Self-pay | Admitting: Emergency Medicine

## 2022-06-03 ENCOUNTER — Emergency Department (HOSPITAL_COMMUNITY): Payer: Medicaid Other

## 2022-06-03 ENCOUNTER — Other Ambulatory Visit: Payer: Self-pay

## 2022-06-03 DIAGNOSIS — R109 Unspecified abdominal pain: Secondary | ICD-10-CM | POA: Diagnosis present

## 2022-06-03 DIAGNOSIS — R7309 Other abnormal glucose: Secondary | ICD-10-CM | POA: Diagnosis not present

## 2022-06-03 DIAGNOSIS — R110 Nausea: Secondary | ICD-10-CM | POA: Diagnosis not present

## 2022-06-03 DIAGNOSIS — M25512 Pain in left shoulder: Secondary | ICD-10-CM | POA: Diagnosis not present

## 2022-06-03 DIAGNOSIS — R630 Anorexia: Secondary | ICD-10-CM | POA: Diagnosis not present

## 2022-06-03 LAB — COMPREHENSIVE METABOLIC PANEL
ALT: 14 U/L (ref 0–44)
AST: 18 U/L (ref 15–41)
Albumin: 4.6 g/dL (ref 3.5–5.0)
Alkaline Phosphatase: 47 U/L (ref 38–126)
Anion gap: 13 (ref 5–15)
BUN: 11 mg/dL (ref 6–20)
CO2: 18 mmol/L — ABNORMAL LOW (ref 22–32)
Calcium: 9.5 mg/dL (ref 8.9–10.3)
Chloride: 106 mmol/L (ref 98–111)
Creatinine, Ser: 0.91 mg/dL (ref 0.44–1.00)
GFR, Estimated: >60 mL/min (ref >60)
Glucose, Bld: 65 mg/dL — ABNORMAL LOW (ref 70–99)
Potassium: 3.6 mmol/L (ref 3.5–5.1)
Sodium: 137 mmol/L (ref 135–145)
Total Bilirubin: 1.4 mg/dL — ABNORMAL HIGH (ref 0.3–1.2)
Total Protein: 7.5 g/dL (ref 6.5–8.1)

## 2022-06-03 LAB — URINALYSIS, ROUTINE W REFLEX MICROSCOPIC
Bilirubin Urine: NEGATIVE
Glucose, UA: NEGATIVE mg/dL
Hgb urine dipstick: NEGATIVE
Ketones, ur: 80 mg/dL — AB
Nitrite: NEGATIVE
Protein, ur: NEGATIVE mg/dL
Specific Gravity, Urine: 1.023 (ref 1.005–1.030)
pH: 5 (ref 5.0–8.0)

## 2022-06-03 LAB — CBC
HCT: 41.1 % (ref 36.0–46.0)
Hemoglobin: 13.1 g/dL (ref 12.0–15.0)
MCH: 28.6 pg (ref 26.0–34.0)
MCHC: 31.9 g/dL (ref 30.0–36.0)
MCV: 89.7 fL (ref 80.0–100.0)
Platelets: 262 10*3/uL (ref 150–400)
RBC: 4.58 MIL/uL (ref 3.87–5.11)
RDW: 13.1 % (ref 11.5–15.5)
WBC: 5.1 10*3/uL (ref 4.0–10.5)
nRBC: 0 % (ref 0.0–0.2)

## 2022-06-03 LAB — D-DIMER, QUANTITATIVE: D-Dimer, Quant: <0.27 ug/mL-FEU (ref 0.00–0.50)

## 2022-06-03 LAB — LIPASE, BLOOD: Lipase: 23 U/L (ref 11–51)

## 2022-06-03 LAB — PREGNANCY, URINE: Preg Test, Ur: NEGATIVE

## 2022-06-03 LAB — CBG MONITORING, ED
Glucose-Capillary: 50 mg/dL — ABNORMAL LOW (ref 70–99)
Glucose-Capillary: 79 mg/dL (ref 70–99)

## 2022-06-03 LAB — TROPONIN I (HIGH SENSITIVITY): Troponin I (High Sensitivity): <2 ng/L (ref <18)

## 2022-06-03 MED ORDER — ONDANSETRON 4 MG PO TBDP: 4.0000 mg | ORAL_TABLET | Freq: Three times a day (TID) | ORAL | 0 refills | Status: DC | PRN

## 2022-06-03 NOTE — ED Triage Notes (Signed)
Patient c/o abdominal cramping and L shoulder pain x2 days. States currently taking Flagyl for BV. Reports she is concerned it is pancreatitis.

## 2022-06-03 NOTE — ED Notes (Signed)
Pt reports she feels much better. Pt is A&OX4 ambulatory at d/c with independent steady gait. Pt verbalized understanding of d/c instructions, prescription and follow up care.

## 2022-06-03 NOTE — Discharge Instructions (Signed)
Your testing is reassuring.  No evidence of heart attack.  No evidence of pancreatitis.  Your ultrasound is normal.  Keep yourself hydrated and take your medications as prescribed.  Your blood sugar was low today likely due to not eating much today. Follow-up with your primary doctor.  Return to the ED with new or worsening symptoms

## 2022-06-03 NOTE — ED Provider Notes (Signed)
[facility]-EMERGENCY DEPT Provider Note   CSN: [id number] Arrival date & time: 06/03/22  1509     History  Chief Complaint  Patient presents with   Abdominal Pain    Stacey Nguyen is a 21 y.o. female.  Patient here with left shoulder pain for the past 2 days.  She is concerned she has "pancreatitis".  She believes this is a side effect of her metronidazole gel and azithromycin that she took on the 17th.  She reports left shoulder pain only when she eats with a decreased appetite and nausea but no vomiting.  Contrary to triage note she denies any abdominal pain.  Denies any fever, chest pain or shortness of breath.  Denies any history of alcohol abuse.  Denies any abnormal vaginal bleeding or discharge.  No pain with urination or blood in the urine.  Denies any trauma to her shoulder.  The pain is to the left upper back worse with movement.  Is also worse when she eats.  The history is provided by the patient.  Abdominal Pain Associated symptoms: no cough, no dysuria, no fever, no hematuria, no nausea, no shortness of breath and no vomiting        Home Medications Prior to Admission medications   Medication Sig Start Date End Date Taking? Authorizing Provider  metroNIDAZOLE (FLAGYL) 500 MG tablet Take 1 tablet (500 mg total) by mouth 2 (two) times daily. 03/02/22   Cathren Laine, MD  metroNIDAZOLE (METROGEL VAGINAL) 0.75 % vaginal gel Place 1 Applicatorful vaginally at bedtime for 5 days. 05/30/22 06/04/22  Petrucelli, Samantha R, PA-C  naproxen (NAPROSYN) 500 MG tablet Take 1 tablet (500 mg total) by mouth 2 (two) times daily. 11/26/21   Renne Crigler, PA-C      Allergies    Patient has no known allergies.    Review of Systems   Review of Systems  Constitutional:  Positive for activity change and appetite change. Negative for fever.  HENT:  Negative for congestion.   Respiratory:  Negative for cough, chest tightness and shortness of breath.    Gastrointestinal:  Positive for abdominal pain. Negative for nausea and vomiting.  Genitourinary:  Negative for dysuria and hematuria.  Musculoskeletal:  Positive for arthralgias and myalgias.  Skin:  Negative for rash.  Neurological:  Negative for dizziness, weakness and headaches.   all other systems are negative except as noted in the HPI and PMH.    Physical Exam Updated Vital Signs BP 108/68   Pulse 77   Temp 98.2 F (36.8 C) (Oral)   Resp 17   LMP 04/30/2022 (Approximate)   SpO2 93%  Physical Exam Vitals and nursing note reviewed.  Constitutional:      General: She is not in acute distress.    Appearance: She is well-developed.  HENT:     Head: Normocephalic and atraumatic.     Mouth/Throat:     Pharynx: No oropharyngeal exudate.  Eyes:     Conjunctiva/sclera: Conjunctivae normal.     Pupils: Pupils are equal, round, and reactive to light.  Neck:     Comments: No meningismus. Cardiovascular:     Rate and Rhythm: Normal rate and regular rhythm.     Heart sounds: Normal heart sounds. No murmur heard. Pulmonary:     Effort: Pulmonary effort is normal. No respiratory distress.     Breath sounds: Normal breath sounds.  Chest:     Chest wall: No tenderness.  Abdominal:     Palpations: Abdomen  is soft.     Tenderness: There is no abdominal tenderness. There is no guarding or rebound.  Musculoskeletal:        General: No tenderness. Normal range of motion.     Cervical back: Normal range of motion and neck supple.     Comments: Pain palpation of left posterior shoulder.  Range of motion is intact.  Equal strength, sensation Equal radial pulses and grip strengths  Skin:    General: Skin is warm.  Neurological:     Mental Status: She is alert and oriented to person, place, and time.     Cranial Nerves: No cranial nerve deficit.     Motor: No abnormal muscle tone.     Coordination: Coordination normal.     Comments:  5/5 strength throughout. CN 2-12 intact.Equal  grip strength.   Psychiatric:        Behavior: Behavior normal.     ED Results / Procedures / Treatments   Labs (all labs ordered are listed, but only abnormal results are displayed) Labs Reviewed  COMPREHENSIVE METABOLIC PANEL - Abnormal; Notable for the following components:      Result Value   CO2 18 (*)    Glucose, Bld 65 (*)    Total Bilirubin 1.4 (*)    All other components within normal limits  URINALYSIS, ROUTINE W REFLEX MICROSCOPIC - Abnormal; Notable for the following components:   APPearance CLOUDY (*)    Ketones, ur 80 (*)    Leukocytes,Ua SMALL (*)    Bacteria, UA RARE (*)    All other components within normal limits  CBG MONITORING, ED - Abnormal; Notable for the following components:   Glucose-Capillary 50 (*)    All other components within normal limits  LIPASE, BLOOD  CBC  PREGNANCY, URINE  D-DIMER, QUANTITATIVE  CBG MONITORING, ED  TROPONIN I (HIGH SENSITIVITY)  TROPONIN I (HIGH SENSITIVITY)    EKG EKG Interpretation  Date/Time:  Saturday June 03 2022 20:35:28 EDT Ventricular Rate:  66 PR Interval:  101 QRS Duration: 76 QT Interval:  411 QTC Calculation: 431 R Axis:   82 Text Interpretation: Sinus rhythm Atrial premature complex Short PR interval Right atrial enlargement No significant change was found Confirmed by Glynn Octave [phone]) on 06/03/2022 8:42:38 PM  Radiology US Abdomen Complete  Result Date: 06/03/2022 CLINICAL DATA:  Abdominal pain for 2 days EXAM: ABDOMEN ULTRASOUND COMPLETE COMPARISON:  CT 11/14/2021 FINDINGS: Gallbladder: No gallstones or wall thickening visualized. No sonographic Murphy sign noted by sonographer. Common bile duct: Diameter: 2 mm, normal Liver: No focal lesion identified. Within normal limits in parenchymal echogenicity. Portal vein is patent on color Doppler imaging with normal direction of blood flow towards the liver. IVC: No abnormality visualized. Pancreas: Visualized portion unremarkable. Spleen: Size and  appearance within normal limits. Right Kidney: Length: 7.6 cm. Echogenicity within normal limits. No mass or hydronephrosis visualized. Left Kidney: Length: 9.5 cm. Echogenicity within normal limits. No mass or hydronephrosis visualized. Abdominal aorta: No aneurysm visualized. Other findings: None. IMPRESSION: Normal ultrasound appearance of the abdomen. Electronically Signed   By: Burman Nieves M.D.   On: 06/03/2022 20:57    Procedures Procedures    Medications Ordered in ED Medications - No data to display  ED Course/ Medical Decision Making/ A&P                           Medical Decision Making Amount and/or Complexity of Data Reviewed Labs: ordered. Decision-making  details documented in ED Course. Radiology: ordered and independent interpretation performed. Decision-making details documented in ED Course. ECG/medicine tests: ordered and independent interpretation performed. Decision-making details documented in ED Course.  Risk Prescription drug management.   Left shoulder pain with decreased appetite and nausea for 2 days.  Denies abdominal pain.  Abdomen soft without peritoneal signs.  Vitals are stable. Full range of motion of left shoulder without bony tenderness.  Low suspicion for fracture or dislocation  Triage labs are reassuring.  Normal LFTs and lipase.  No leukocytosis EKG normal sinus rhythm.  Low suspicion for ACS or pulmonary embolism. CXR 5 days ago was normal.   Ultrasound is reassuring.  No gallstones.  Results reviewed and interpreted by me.  Lipase and LFTs are normal.  Some hypoglycemia noted but patient is asymptomatic from this and denies feeling dizzy or lightheaded.  Shoulder pain has resolved.  Low suspicion for ACS or pulmonary embolism.  No evidence of aortic dissection.  She is tolerating p.o.  Blood sugar has improved  Continue hydration at home, PCP follow-up, return precautions discussed.        Final Clinical Impression(s) / ED  Diagnoses Final diagnoses:  Acute pain of left shoulder    Rx / DC Orders ED Discharge Orders     None         Janet Decesare, Jeannett Senior, MD 06/03/22 2321

## 2022-06-03 NOTE — ED Provider Triage Note (Signed)
Emergency Medicine Provider Triage Evaluation Note  Stacey Nguyen , a 21 y.o. female  was evaluated in triage.  Pt complains of abd pain. Report L shoulder pain, decrease appetite, and having nausea x 2 days also report mild abdominal cramping and increase urinary frequency and urgency.  Denies alcohol abuse.  Denies fever, cp, sob  Review of Systems  Positive: As above Negative: As above  Physical Exam  BP 126/85   Pulse 86   Temp 98.2 F (36.8 C) (Oral)   Resp 18   LMP 04/30/2022 (Approximate)   SpO2 99%  Gen:   Awake, no distress   Resp:  Normal effort  MSK:   Moves extremities without difficulty  Other:    Medical Decision Making  Medically screening exam initiated at 5:01 PM.  Appropriate orders placed.  Javen Muriah Harsha was informed that the remainder of the evaluation will be completed by another provider, this initial triage assessment does not replace that evaluation, and the importance of remaining in the ED until their evaluation is complete.     Fayrene Helper, PA-C 06/03/22 1704

## 2022-07-25 ENCOUNTER — Other Ambulatory Visit: Payer: Self-pay

## 2022-07-25 ENCOUNTER — Emergency Department (HOSPITAL_COMMUNITY)
Admission: EM | Admit: 2022-07-25 | Discharge: 2022-07-25 | Disposition: A | Discharge status: Home / Self Care | Payer: Medicaid Other | Attending: Emergency Medicine | Admitting: Emergency Medicine

## 2022-07-25 ENCOUNTER — Encounter (HOSPITAL_COMMUNITY): Payer: Self-pay

## 2022-07-25 DIAGNOSIS — R0981 Nasal congestion: Secondary | ICD-10-CM | POA: Diagnosis not present

## 2022-07-25 DIAGNOSIS — N898 Other specified noninflammatory disorders of vagina: Secondary | ICD-10-CM | POA: Insufficient documentation

## 2022-07-25 DIAGNOSIS — J3489 Other specified disorders of nose and nasal sinuses: Secondary | ICD-10-CM | POA: Insufficient documentation

## 2022-07-25 DIAGNOSIS — R519 Headache, unspecified: Secondary | ICD-10-CM | POA: Insufficient documentation

## 2022-07-25 DIAGNOSIS — R059 Cough, unspecified: Secondary | ICD-10-CM | POA: Insufficient documentation

## 2022-07-25 DIAGNOSIS — B9689 Other specified bacterial agents as the cause of diseases classified elsewhere: Secondary | ICD-10-CM

## 2022-07-25 LAB — WET PREP, GENITAL
Sperm: NONE SEEN
Trich, Wet Prep: NONE SEEN
WBC, Wet Prep HPF POC: <10 (ref <10)
Yeast Wet Prep HPF POC: NONE SEEN

## 2022-07-25 MED ORDER — ONDANSETRON HCL 4 MG/2ML IJ SOLN: 4.0000 mg | Freq: Once | INTRAMUSCULAR | Status: DC

## 2022-07-25 MED ORDER — METRONIDAZOLE 500 MG PO TABS
500.0000 mg | ORAL_TABLET | Freq: Two times a day (BID) | ORAL | 0 refills | Status: DC
Start: 2022-07-25 — End: 2023-11-06

## 2022-07-25 MED ORDER — SODIUM CHLORIDE 0.9 % IV BOLUS: 1000.0000 mL | Freq: Once | INTRAVENOUS | Status: DC

## 2022-07-25 NOTE — ED Triage Notes (Signed)
Pt reports headache with cough and runny nose x1 week, worse when bending forward.  Pt also reports being treated for BV and odor is no better and wants additional treatment.

## 2022-07-25 NOTE — ED Provider Notes (Signed)
[facility]-EMERGENCY DEPT Provider Note   CSN: [id number] Arrival date & time: 07/25/22  2054     History  Chief Complaint  Patient presents with   Headache    Stacey Nguyen is a 21 y.o. female.  Complaining of a week of intermittent sinus congestion, rhinorrhea, cough.  No known sick contacts.  Patient states she also has a headache when she notices the symptoms.  Currently asymptomatic.  Denies fevers, chills, body aches, nausea, vomiting, diarrhea, chest pain, shortness of breath.  Patient also complaining of vaginal odor.  She has recently been treated for BV and chlamydia.  States odor was improving while on treatment, but has since gotten worse.  Denies dysuria, frequency, urgency, vaginal discharge, dyspareunia   Headache Associated symptoms: congestion and sinus pressure        Home Medications Prior to Admission medications   Medication Sig Start Date End Date Taking? Authorizing Provider  metroNIDAZOLE (FLAGYL) 500 MG tablet Take 1 tablet (500 mg total) by mouth 2 (two) times daily. Patient not taking: Reported on 06/03/2022 03/02/22   Cathren Laine, MD  naproxen (NAPROSYN) 500 MG tablet Take 1 tablet (500 mg total) by mouth 2 (two) times daily. Patient not taking: Reported on 06/03/2022 11/26/21   Renne Crigler, PA-C  ondansetron (ZOFRAN-ODT) 4 MG disintegrating tablet Take 1 tablet (4 mg total) by mouth every 8 (eight) hours as needed for nausea or vomiting. 06/03/22   Glynn Octave, MD      Allergies    Patient has no known allergies.    Review of Systems   Review of Systems  HENT:  Positive for congestion, rhinorrhea and sinus pressure.   Genitourinary:        Vaginal odor  Neurological:  Positive for headaches.  All other systems reviewed and are negative.   Physical Exam Updated Vital Signs BP (!) 140/94 (BP Location: Left Arm)   Pulse (!) 105   Temp 98.8 F (37.1 C) (Oral)   Resp 16   Ht 5\' 3"  (1.6 m)   Wt 54.4 kg    SpO2 100%   BMI 21.26 kg/m  Physical Exam Vitals and nursing note reviewed.  Constitutional:      General: She is not in acute distress.    Appearance: She is well-developed. She is not ill-appearing.  HENT:     Head: Normocephalic and atraumatic.  Eyes:     Conjunctiva/sclera: Conjunctivae normal.  Cardiovascular:     Rate and Rhythm: Normal rate and regular rhythm.     Heart sounds: Normal heart sounds. No murmur heard. Pulmonary:     Effort: Pulmonary effort is normal. No respiratory distress.     Breath sounds: Normal breath sounds. No wheezing or rales.  Abdominal:     Palpations: Abdomen is soft.     Tenderness: There is no abdominal tenderness.  Musculoskeletal:        General: No swelling.     Cervical back: Neck supple.  Skin:    General: Skin is warm and dry.     Capillary Refill: Capillary refill takes less than 2 seconds.  Neurological:     Mental Status: She is alert.  Psychiatric:        Mood and Affect: Mood normal.     ED Results / Procedures / Treatments   Labs (all labs ordered are listed, but only abnormal results are displayed) Labs Reviewed  WET PREP, GENITAL    EKG None  Radiology No results found.  Procedures Procedures    Medications Ordered in ED Medications - No data to display  ED Course/ Medical Decision Making/ A&P                           Medical Decision Making Amount and/or Complexity of Data Reviewed Labs: ordered.  This patient presents to the ED for concern of sinus congestion, headaches, vaginal odor, this involves an extensive number of treatment options, and is a complaint that carries with it a high risk of complications and morbidity.  The differential diagnosis includes viral URI, COVID, flu, BV, candidiasis, trichomonas, chlamydia, gonorrhea   Additional history obtained from: Nursing notes from this visit. Previous records within EMR system patient recently seen for vaginal symptoms, was treated for BV was  tested for chlamydia gonorrhea at that time as well.  This was positive.  She returned later for chlamydia treatment   I ordered, reviewed and interpreted labs which include: Wet prep.  Patient initially declined all testing, but was later agreeable to wet prep.  Strongly   Afebrile, hemodynamically stable.  Patient likely has URI.  We will treat this with Claritin and Flonase.  I offered to write these as prescriptions, however patient stated she would rather pick them up as over-the-counter.  Patient also complaining of a return of her vaginal odor.  I ordered a wet prep.  This showed clue cells.  We will treat for bacterial vaginosis again.  After further questioning, patient stated she did not complete metronidazole course last time stating she just did not like it.  I strongly encouraged her to take the entire duration this time.  I recommended patient follow-up with gynecology.  Stable at discharge.  At this time there does not appear to be any evidence of an acute emergency medical condition and the patient appears stable for discharge with appropriate outpatient follow up. Diagnosis was discussed with patient who verbalizes understanding of care plan and is agreeable to discharge. I have discussed return precautions with patient who verbalizes understanding. Patient encouraged to follow-up with their PCP within 1 week. All questions answered.  Note: Portions of this report may have been transcribed using voice recognition software. Every effort was made to ensure accuracy; however, inadvertent computerized transcription errors may still be present.          Final Clinical Impression(s) / ED Diagnoses Final diagnoses:  None    Rx / DC Orders ED Discharge Orders     None         Mora Bellman 07/25/22 2248    Gwyneth Sprout, MD 07/25/22 2318

## 2022-07-25 NOTE — Discharge Instructions (Addendum)
You have been seen today for your complaint of upper respiratory infection symptoms and vaginal odor. Your lab work showed you to have bacterial vaginosis. Your discharge medications include metronidazole.  This is an antibiotic.  You should take it for the entire duration.  You should take it as prescribed.  This may cause an upset stomach.  This is normal.  You can take it with food.  You may also take Claritin and Flonase.  These are both over-the-counter medications used for upper respiratory infection symptoms.  You should follow dosing instructions for Claritin that are found on the bottle.  You should take 2 sprays of Flonase in each nostril for the first 2 days, and then 1 spray in each nostril each day after that until resolution of symptoms.. Follow up with: [facility].  You may call anyone you would like to schedule an appointment. Please seek immediate medical care if you develop any of the following symptoms: Your symptoms do not improve, even after treatment. You have more discharge or pain when urinating. You have a fever or chills. You have pain in your abdomen or pelvis. You have pain during sex. You have vaginal bleeding between menstrual periods. At this time there does not appear to be the presence of an emergent medical condition, however there is always the potential for conditions to change. Please read and follow the below instructions.  Do not take your medicine if  develop an itchy rash, swelling in your mouth or lips, or difficulty breathing; call 911 and seek immediate emergency medical attention if this occurs.  You may review your lab tests and imaging results in their entirety on your MyChart account.  Please discuss all results of fully with your primary care provider and other specialist at your follow-up visit.  Note: Portions of this text may have been transcribed using voice recognition software. Every effort was made to ensure accuracy; however, inadvertent  computerized transcription errors may still be present.

## 2022-08-09 ENCOUNTER — Emergency Department (HOSPITAL_COMMUNITY): Payer: Medicaid Other

## 2022-08-09 ENCOUNTER — Other Ambulatory Visit: Payer: Self-pay

## 2022-08-09 ENCOUNTER — Encounter (HOSPITAL_COMMUNITY): Payer: Self-pay

## 2022-08-09 ENCOUNTER — Emergency Department (HOSPITAL_COMMUNITY)
Admission: EM | Admit: 2022-08-09 | Discharge: 2022-08-09 | Disposition: A | Discharge status: Home / Self Care | Payer: Medicaid Other | Attending: Emergency Medicine | Admitting: Emergency Medicine

## 2022-08-09 DIAGNOSIS — R110 Nausea: Secondary | ICD-10-CM | POA: Insufficient documentation

## 2022-08-09 DIAGNOSIS — R109 Unspecified abdominal pain: Secondary | ICD-10-CM | POA: Diagnosis not present

## 2022-08-09 DIAGNOSIS — R350 Frequency of micturition: Secondary | ICD-10-CM | POA: Diagnosis not present

## 2022-08-09 DIAGNOSIS — N9489 Other specified conditions associated with female genital organs and menstrual cycle: Secondary | ICD-10-CM | POA: Diagnosis not present

## 2022-08-09 DIAGNOSIS — R8281 Pyuria: Secondary | ICD-10-CM | POA: Diagnosis not present

## 2022-08-09 LAB — URINALYSIS, ROUTINE W REFLEX MICROSCOPIC
Bilirubin Urine: NEGATIVE
Glucose, UA: NEGATIVE mg/dL
Ketones, ur: NEGATIVE mg/dL
Nitrite: NEGATIVE
Protein, ur: 100 mg/dL — AB
RBC / HPF: >50 RBC/hpf — ABNORMAL HIGH (ref 0–5)
Specific Gravity, Urine: 1.023 (ref 1.005–1.030)
WBC, UA: >50 WBC/hpf — ABNORMAL HIGH (ref 0–5)
pH: 5 (ref 5.0–8.0)

## 2022-08-09 LAB — CBC WITH DIFFERENTIAL/PLATELET
Abs Immature Granulocytes: 0 10*3/uL (ref 0.00–0.07)
Basophils Absolute: 0.1 10*3/uL (ref 0.0–0.1)
Basophils Relative: 2 %
Eosinophils Absolute: 0.1 10*3/uL (ref 0.0–0.5)
Eosinophils Relative: 2 %
HCT: 39.9 % (ref 36.0–46.0)
Hemoglobin: 12.9 g/dL (ref 12.0–15.0)
Immature Granulocytes: 0 %
Lymphocytes Relative: 40 %
Lymphs Abs: 1.9 10*3/uL (ref 0.7–4.0)
MCH: 29.5 pg (ref 26.0–34.0)
MCHC: 32.3 g/dL (ref 30.0–36.0)
MCV: 91.1 fL (ref 80.0–100.0)
Monocytes Absolute: 0.3 10*3/uL (ref 0.1–1.0)
Monocytes Relative: 7 %
Neutro Abs: 2.3 10*3/uL (ref 1.7–7.7)
Neutrophils Relative %: 49 %
Platelets: 242 10*3/uL (ref 150–400)
RBC: 4.38 MIL/uL (ref 3.87–5.11)
RDW: 12.5 % (ref 11.5–15.5)
WBC: 4.7 10*3/uL (ref 4.0–10.5)
nRBC: 0 % (ref 0.0–0.2)

## 2022-08-09 LAB — BASIC METABOLIC PANEL
Anion gap: 5 (ref 5–15)
BUN: 7 mg/dL (ref 6–20)
CO2: 23 mmol/L (ref 22–32)
Calcium: 9.2 mg/dL (ref 8.9–10.3)
Chloride: 110 mmol/L (ref 98–111)
Creatinine, Ser: 0.69 mg/dL (ref 0.44–1.00)
GFR, Estimated: >60 mL/min (ref >60)
Glucose, Bld: 81 mg/dL (ref 70–99)
Potassium: 3.7 mmol/L (ref 3.5–5.1)
Sodium: 138 mmol/L (ref 135–145)

## 2022-08-09 LAB — I-STAT BETA HCG BLOOD, ED (MC, WL, AP ONLY): I-stat hCG, quantitative: <5 m[IU]/mL (ref <5)

## 2022-08-09 MED ORDER — CEPHALEXIN 500 MG PO CAPS
500.0000 mg | ORAL_CAPSULE | Freq: Once | ORAL | Status: AC
  Administered 2022-08-09: 500 mg via ORAL
  Filled 2022-08-09: qty 1

## 2022-08-09 MED ORDER — CEPHALEXIN 500 MG PO CAPS: 500.0000 mg | ORAL_CAPSULE | Freq: Two times a day (BID) | ORAL | 0 refills | Status: AC

## 2022-08-09 NOTE — ED Provider Notes (Signed)
[facility]-EMERGENCY DEPT Provider Note   CSN: [id number] Arrival date & time: 08/09/22  1902     History  Chief Complaint  Patient presents with   Flank Pain    Stacey Nguyen is a 21 y.o. female.  22 year old female presents to the emergency department for evaluation of left-sided flank pain with associated urinary frequency for the past 3 days.  She reports that symptoms have been persistent.  She has not tried any medications for her symptoms.  Does complain of some associated nausea, but has not had any vomiting, dysuria, bowel changes, vaginal discharge.  She began her menstrual cycle yesterday.  Does report starting omeprazole about 3 days before symptom onset; is concerned that her pain and urinary complaints are a side effect from this medicine.  She has no history of abdominal surgeries.  The history is provided by the patient. No language interpreter was used.  Flank Pain       Home Medications Prior to Admission medications   Medication Sig Start Date End Date Taking? Authorizing Provider  cephALEXin (KEFLEX) 500 MG capsule Take 1 capsule (500 mg total) by mouth 2 (two) times daily for 7 days. 08/09/22 08/16/22 Yes Antony Madura, PA-C  metroNIDAZOLE (FLAGYL) 500 MG tablet Take 1 tablet (500 mg total) by mouth 2 (two) times daily. 07/25/22   Schutt, Edsel Petrin, PA-C  naproxen (NAPROSYN) 500 MG tablet Take 1 tablet (500 mg total) by mouth 2 (two) times daily. Patient not taking: Reported on 06/03/2022 11/26/21   Renne Crigler, PA-C  ondansetron (ZOFRAN-ODT) 4 MG disintegrating tablet Take 1 tablet (4 mg total) by mouth every 8 (eight) hours as needed for nausea or vomiting. 06/03/22   Glynn Octave, MD      Allergies    Patient has no known allergies.    Review of Systems   Review of Systems  Genitourinary:  Positive for flank pain.  Ten systems reviewed and are negative for acute change, except as noted in the HPI.    Physical  Exam Updated Vital Signs BP 116/75   Pulse 64   Temp 99 F (37.2 C) (Oral)   Resp 17   Ht 5\' 3"  (1.6 m)   Wt 54.4 kg   LMP 08/09/2022 (Exact Date)   SpO2 100%   BMI 21.26 kg/m   Physical Exam Vitals and nursing note reviewed.  Constitutional:      General: She is not in acute distress.    Appearance: She is well-developed. She is not diaphoretic.     Comments: Nontoxic appearing and in NAD  HENT:     Head: Normocephalic and atraumatic.  Eyes:     General: No scleral icterus.    Conjunctiva/sclera: Conjunctivae normal.  Pulmonary:     Effort: Pulmonary effort is normal. No respiratory distress.  Abdominal:     General: There is no distension.     Palpations: Abdomen is soft.     Tenderness: There is abdominal tenderness (CVA, left).  Musculoskeletal:        General: Normal range of motion.     Cervical back: Normal range of motion.  Skin:    General: Skin is warm and dry.     Coloration: Skin is not pale.     Findings: No erythema or rash.  Neurological:     Mental Status: She is alert and oriented to person, place, and time.     Coordination: Coordination normal.  Psychiatric:  Behavior: Behavior normal.     ED Results / Procedures / Treatments   Labs (all labs ordered are listed, but only abnormal results are displayed) Labs Reviewed  URINALYSIS, ROUTINE W REFLEX MICROSCOPIC - Abnormal; Notable for the following components:      Result Value   APPearance CLOUDY (*)    Hgb urine dipstick LARGE (*)    Protein, ur 100 (*)    Leukocytes,Ua SMALL (*)    RBC / HPF >50 (*)    WBC, UA >50 (*)    Bacteria, UA RARE (*)    All other components within normal limits  URINE CULTURE  BASIC METABOLIC PANEL  CBC WITH DIFFERENTIAL/PLATELET  I-STAT BETA HCG BLOOD, ED ([facility], [facility], [facility] ONLY)    EKG None  Radiology CT Renal Stone Study  Result Date: 08/09/2022 CLINICAL DATA:  Left flank pain and back pain, kidney stone suspected. EXAM: CT ABDOMEN AND PELVIS WITHOUT  CONTRAST TECHNIQUE: Multidetector CT imaging of the abdomen and pelvis was performed following the standard protocol without IV contrast. RADIATION DOSE REDUCTION: This exam was performed according to the departmental dose-optimization program which includes automated exposure control, adjustment of the mA and/or kV according to patient size and/or use of iterative reconstruction technique. COMPARISON:  11/14/2021. FINDINGS: Lower chest: No acute abnormality. Hepatobiliary: No focal liver abnormality is seen. No gallstones, gallbladder wall thickening, or biliary dilatation. Pancreas: Unremarkable. No pancreatic ductal dilatation or surrounding inflammatory changes. Spleen: Normal in size without focal abnormality. Adrenals/Urinary Tract: Adrenal glands are unremarkable. Kidneys are normal, without renal calculi, focal lesion, or hydronephrosis. Bladder is unremarkable. Stomach/Bowel: Stomach is within normal limits. Appendix appears normal. No evidence of bowel wall thickening, distention, or inflammatory changes. No free air or pneumatosis. Vascular/Lymphatic: No significant vascular findings are present. No enlarged abdominal or pelvic lymph nodes. Reproductive: Uterus and bilateral adnexa are unremarkable. Other: Small fat containing umbilical hernia. No abdominopelvic ascites. Musculoskeletal: No acute or significant osseous findings. IMPRESSION: No acute intra-abdominal process. Electronically Signed   By: Thornell Sartorius M.D.   On: 08/09/2022 21:18    Procedures Procedures    Medications Ordered in ED Medications  cephALEXin (KEFLEX) capsule 500 mg (has no administration in time range)    ED Course/ Medical Decision Making/ A&P Clinical Course as of 08/09/22 2303  Wed Aug 09, 2022  2255 Patient's labs reviewed.  She has pyuria and hematuria, but does report presently being on her menstrual cycle.  Low number of squamous cells present.  No significant bacteria.  This may represent findings of  hemorrhagic cystitis, possible early pyelonephritis, though results could also reflect the presence of menstrual blood.  Pyelonephritis is seemingly less likely compared to uncomplicated UTI.  Patient does not have any systemic is of infection.  No fever, tachycardia, leukocytosis or left shift.  Kidney function preserved.  Her pregnancy test is negative ruling out ectopic.  I have reviewed and interpreted the patient's CT scan which shows no acute findings.  Specifically, no kidney stone.  Uterus and bilateral adnexa are unremarkable. [KH]    Clinical Course User Index [KH] Antony Madura, PA-C                           Medical Decision Making Amount and/or Complexity of Data Reviewed Labs: ordered.  Risk Prescription drug management.   This patient presents to the ED for concern of left flank pain, this involves an extensive number of treatment options, and is a complaint  that carries with it a high risk of complications and morbidity.  The differential diagnosis includes kidney stone vs UTI/pyelonephritis vs MSK vs ovarian cyst vs TOA vs ovarian torsion vs ectopic pregnancy   Co morbidities that complicate the patient evaluation  None   Additional history obtained:  External records from outside source obtained and reviewed including abdominal US from 2 months ago; negative for acute pathology   Lab Tests:  I Ordered, and personally interpreted labs.  The pertinent results include:  pyuria and hematuria on UA without nitrites, bacteriuria. CBC and BMP normal. Pregnancy negative.   Imaging Studies ordered:  I ordered imaging studies including CT renal study  I independently visualized and interpreted imaging which showed no acute pathology I agree with the radiologist interpretation   Medicines ordered and prescription drug management:  I ordered medication including Keflex for UTI  Reevaluation of the patient after these medicines showed that the patient stayed the same I  have reviewed the patients home medicines and have made adjustments as needed   Test Considered:  Wet prep   Problem List / ED Course:  As above   Reevaluation:  After the interventions noted above, I reevaluated the patient and found that they have : remained stable   Social Determinants of Health:  Insured patient   Dispostion:  After consideration of the diagnostic results and the patients response to treatment, I feel that the patent would benefit from outpatient course of Keflex, PCP follow up. Return precautions discussed and provided. Patient discharged in stable condition with no unaddressed concerns.          Final Clinical Impression(s) / ED Diagnoses Final diagnoses:  Left flank pain  Pyuria    Rx / DC Orders ED Discharge Orders          Ordered    cephALEXin (KEFLEX) 500 MG capsule  2 times daily        08/09/22 2254              Antonietta Breach, PA-C 57/01/77 9390    Delora Fuel, MD 30/09/23 [phone]

## 2022-08-09 NOTE — Discharge Instructions (Addendum)
Take Keflex as prescribed until finished.  We recommend 600 mg ibuprofen every 6 hours for management of pain.  Follow-up with a primary care doctor to ensure resolution of symptoms.

## 2022-08-09 NOTE — ED Triage Notes (Signed)
Patient reports taking omeprazole and having left sided flank and back pain after taking the medication. Patient is also taking flagyl for BV. Patient has never taken omeprazole before. Patient reports frequent urination and urine discoloration.

## 2022-08-09 NOTE — ED Provider Triage Note (Signed)
Emergency Medicine Provider Triage Evaluation Note  Rozell Searing , a 21 y.o. female  was evaluated in triage.  Pt complains of left-sided flank pain.  Pain has been present for about 3 days with some associated nausea, pain radiates down into the left lower abdomen.  She reports some associated urinary frequency and a dark color to her urine.  Reports that she had recently started taking omeprazole about 3 days before the symptoms started, she stopped taking the medication and then started having this pain and was concerned it was a side effect from the medicine.  Review of Systems  Positive: Flank pain, nausea, discoloration of urine, urinary frequency Negative: Fevers, vomiting  Physical Exam  BP 120/84 (BP Location: Left Arm)   Pulse 82   Temp 99 F (37.2 C) (Oral)   Resp 16   Ht 5\' 3"  (1.6 m)   Wt 54.4 kg   LMP 08/09/2022 (Exact Date)   SpO2 100%   BMI 21.26 kg/m  Gen:   Awake, no distress   Resp:  Normal effort  MSK:   Moves extremities without difficulty  Other:  Left lower quadrant abdominal tenderness and left CVA tenderness  Medical Decision Making  Medically screening exam initiated at 8:12 PM.  Appropriate orders placed.  Railyn Paloma Grange was informed that the remainder of the evaluation will be completed by another provider, this initial triage assessment does not replace that evaluation, and the importance of remaining in the ED until their evaluation is complete.  Concern for UTI, pyelo or kidney stone, labs and CT renal ordered.   Jacqlyn Larsen, [place] 08/09/22 2022

## 2022-08-11 LAB — URINE CULTURE: Culture: >=100000 — AB

## 2022-08-12 ENCOUNTER — Telehealth (HOSPITAL_BASED_OUTPATIENT_CLINIC_OR_DEPARTMENT_OTHER): Payer: Self-pay | Admitting: *Deleted

## 2022-08-12 NOTE — Progress Notes (Signed)
ED Antimicrobial Stewardship Positive Culture Follow Up   Stacey Nguyen is an 21 y.o. female who presented to [facility] on 08/09/2022 with a chief complaint of  Chief Complaint  Patient presents with   Flank Pain    Recent Results (from the past 720 hour(s))  Wet prep, genital     Status: Abnormal   Collection Time: 07/25/22  9:31 PM   Specimen: Cervix  Result Value Ref Range Status   Yeast Wet Prep HPF POC NONE SEEN NONE SEEN Final   Trich, Wet Prep NONE SEEN NONE SEEN Final   Clue Cells Wet Prep HPF POC PRESENT (A) NONE SEEN Final   WBC, Wet Prep HPF POC <10 <10 Final   Sperm NONE SEEN  Final    Comment: Performed at [facility], [place] [address], [place], [place] [address]  Urine Culture     Status: Abnormal   Collection Time: 08/09/22  8:47 PM   Specimen: Urine, Clean Catch  Result Value Ref Range Status   Specimen Description   Final    URINE, CLEAN CATCH Performed at [facility], [place] [address] [place], [place] [address]    Special Requests   Final    NONE Performed at [facility], [place] [address] [place], [place] [address]    Culture (A)  Final    >=100,000 COLONIES/mL LACTOBACILLUS SPECIES Standardized susceptibility testing for this organism is not available. Performed at [facility], [place] [address], [place], [place] [address]    Report Status 08/11/2022 FINAL  Final    [x]  Treated with cephalexin, organism resistant to prescribed antimicrobial []  Patient discharged originally without antimicrobial agent and treatment is now indicated  New antibiotic prescription: amoxillin 500mg  PO q8h x 5 days  ED Provider: Astrid Drafts, PA-C   Peggyann Juba, PharmD, BCPS 08/12/2022, 9:05 AM Clinical Pharmacist [phone]

## 2022-08-12 NOTE — Telephone Encounter (Signed)
Post ED Visit - Positive Culture Follow-up: Unsuccessful Patient Follow-up  Culture assessed and recommendations reviewed by:  [x]  Lubertha Sayres.D. []  Heide Guile, Pharm.D., BCPS AQ-ID []  Parks Neptune, Pharm.D., BCPS []  Alycia Rossetti, Pharm.D., BCPS []  [place], Pharm.D., BCPS, AAHIVP []  Legrand Como, Pharm.D., BCPS, AAHIVP []  Wynell Balloon, PharmD []  Vincenza Hews, PharmD, BCPS  Positive urine culture  []  Patient discharged without antimicrobial prescription and treatment is now indicated [x]  Organism is resistant to prescribed ED discharge antimicrobial []  Patient with positive blood cultures  New Rx: Amoxicillin 500mg  PO q8hrs x 5 days.  Unable to contact patient , letter will be sent to address on file  Rosie Fate 08/12/2022, 11:40 AM

## 2022-09-20 ENCOUNTER — Emergency Department (HOSPITAL_COMMUNITY)
Admission: EM | Admit: 2022-09-20 | Discharge: 2022-09-20 | Disposition: A | Discharge status: Home / Self Care | Payer: Medicaid Other | Attending: Emergency Medicine | Admitting: Emergency Medicine

## 2022-09-20 ENCOUNTER — Encounter (HOSPITAL_COMMUNITY): Payer: Self-pay

## 2022-09-20 DIAGNOSIS — R079 Chest pain, unspecified: Secondary | ICD-10-CM | POA: Diagnosis present

## 2022-09-20 DIAGNOSIS — J209 Acute bronchitis, unspecified: Secondary | ICD-10-CM | POA: Diagnosis not present

## 2022-09-20 MED ORDER — ALBUTEROL SULFATE HFA 108 (90 BASE) MCG/ACT IN AERS: 1.0000 | INHALATION_SPRAY | Freq: Four times a day (QID) | RESPIRATORY_TRACT | 0 refills | Status: AC | PRN

## 2022-09-20 MED ORDER — BENZONATATE 200 MG PO CAPS: 200.0000 mg | ORAL_CAPSULE | Freq: Three times a day (TID) | ORAL | 0 refills | Status: AC

## 2022-09-20 NOTE — ED Provider Triage Note (Signed)
Emergency Medicine Provider Triage Evaluation Note  Stacey Nguyen , a 21 y.o. female  was evaluated in triage.  Pt complains of cough, congestion, body aches, coughing up mucous x 1 week. Home COVID negative.  Review of Systems  Positive: As above Negative: As above   Physical Exam  BP 112/76   Pulse 80   Temp 98.8 F (37.1 C) (Oral)   Resp 18   Ht 5\' 3"  (1.6 m)   Wt 53.2 kg   LMP 09/01/2022   SpO2 100%   BMI 20.79 kg/m  Gen:   Awake, no distress   Resp:  Normal effort  MSK:   Moves extremities without difficulty  Other:    Medical Decision Making  Medically screening exam initiated at 2:10 PM.  Appropriate orders placed.  Stacey Nguyen was informed that the remainder of the evaluation will be completed by another provider, this initial triage assessment does not replace that evaluation, and the importance of remaining in the ED until their evaluation is complete.     Artist Pais, PA-C 09/20/22 1412

## 2022-09-20 NOTE — ED Provider Notes (Signed)
[facility]-EMERGENCY DEPT Provider Note   CSN: [id number] Arrival date & time: 09/20/22  1345     History  Chief Complaint  Patient presents with   Chest Pain    Stacey Nguyen is a 21 y.o. female.  Pt complains of cough, congestion, body aches, coughing up mucous x 1 week. Home COVID negative.  Also asking about hernia noted on her last ER CT scan.       Home Medications Prior to Admission medications   Medication Sig Start Date End Date Taking? Authorizing Provider  albuterol (VENTOLIN HFA) 108 (90 Base) MCG/ACT inhaler Inhale 1-2 puffs into the lungs every 6 (six) hours as needed for wheezing or shortness of breath. 09/20/22  Yes Jeannie Fend, PA-C  benzonatate (TESSALON) 200 MG capsule Take 1 capsule (200 mg total) by mouth every 8 (eight) hours for 10 days. 09/20/22 09/30/22 Yes Jeannie Fend, PA-C  metroNIDAZOLE (FLAGYL) 500 MG tablet Take 1 tablet (500 mg total) by mouth 2 (two) times daily. 07/25/22   Schutt, Edsel Petrin, PA-C  naproxen (NAPROSYN) 500 MG tablet Take 1 tablet (500 mg total) by mouth 2 (two) times daily. Patient not taking: Reported on 06/03/2022 11/26/21   Renne Crigler, PA-C  ondansetron (ZOFRAN-ODT) 4 MG disintegrating tablet Take 1 tablet (4 mg total) by mouth every 8 (eight) hours as needed for nausea or vomiting. 06/03/22   Glynn Octave, MD      Allergies    Patient has no known allergies.    Review of Systems   Review of Systems Negative except as per HPI Physical Exam Updated Vital Signs BP 112/76   Pulse 80   Temp 98.8 F (37.1 C) (Oral)   Resp 18   Ht 5\' 3"  (1.6 m)   Wt 53.2 kg   LMP 09/01/2022   SpO2 100%   BMI 20.79 kg/m  Physical Exam Vitals and nursing note reviewed.  Constitutional:      General: She is not in acute distress.    Appearance: She is well-developed. She is not diaphoretic.  HENT:     Head: Normocephalic and atraumatic.     Right Ear: Tympanic membrane and ear canal normal.      Left Ear: Tympanic membrane and ear canal normal.     Nose: Nose normal.     Mouth/Throat:     Mouth: Mucous membranes are moist.  Eyes:     Conjunctiva/sclera: Conjunctivae normal.  Cardiovascular:     Rate and Rhythm: Normal rate and regular rhythm.     Heart sounds: Normal heart sounds.  Pulmonary:     Effort: Pulmonary effort is normal.     Breath sounds: Normal breath sounds.  Abdominal:     Palpations: Abdomen is soft.     Tenderness: There is no abdominal tenderness.     Hernia: A hernia is present. Hernia is present in the umbilical area.  Musculoskeletal:     Cervical back: Neck supple.  Lymphadenopathy:     Cervical: Cervical adenopathy present.  Skin:    General: Skin is warm and dry.     Findings: No erythema or rash.  Neurological:     Mental Status: She is alert and oriented to person, place, and time.  Psychiatric:        Behavior: Behavior normal.     ED Results / Procedures / Treatments   Labs (all labs ordered are listed, but only abnormal results are displayed) Labs Reviewed - No data to  display  EKG None  Radiology No results found.  Procedures Procedures    Medications Ordered in ED Medications - No data to display  ED Course/ Medical Decision Making/ A&P                           Medical Decision Making Risk Prescription drug management.   21 year old female with URI symptoms for the past week, COVID-negative at home.  Reports persistent cough.  She is also concerned after reading her MyChart account for her CT renal stone study completed on 08/09/2022 in regards to a finding of a hernia.  Imaging report reviewed with patient, notes a small fat-containing umbilical hernia.  Patient is examined, abdomen is soft and nontender.  She does have a small umbilical hernia.  Discussed larger hernias become incarcerated, if this is bothersome for her she can follow-up with general surgery.  In regards to her cough for the past week, discussed  possible bronchitis post viral illness.  She is provided with Tessalon and albuterol inhaler.  She is afebrile, vitals are stable including O2 sat 100% on room air.        Final Clinical Impression(s) / ED Diagnoses Final diagnoses:  Acute bronchitis, unspecified organism    Rx / DC Orders ED Discharge Orders          Ordered    albuterol (VENTOLIN HFA) 108 (90 Base) MCG/ACT inhaler  Every 6 hours PRN        09/20/22 1414    benzonatate (TESSALON) 200 MG capsule  Every 8 hours        09/20/22 1414              Jeannie Fend, PA-C 09/20/22 2024    Benjiman Core, MD 09/21/22 [phone]

## 2022-09-20 NOTE — ED Triage Notes (Signed)
Pt presents with c/o chest pain that started today. Pt also concerned that she may have a hernia because she reports that her mychart said something about it.

## 2022-11-22 ENCOUNTER — Encounter (HOSPITAL_COMMUNITY): Payer: Self-pay

## 2022-11-22 ENCOUNTER — Emergency Department (HOSPITAL_COMMUNITY)
Admission: EM | Admit: 2022-11-22 | Discharge: 2022-11-22 | Disposition: A | Discharge status: Home / Self Care | Payer: Medicaid Other | Attending: Emergency Medicine | Admitting: Emergency Medicine

## 2022-11-22 DIAGNOSIS — X500XXA Overexertion from strenuous movement or load, initial encounter: Secondary | ICD-10-CM | POA: Insufficient documentation

## 2022-11-22 DIAGNOSIS — R0789 Other chest pain: Secondary | ICD-10-CM

## 2022-11-22 MED ORDER — NAPROXEN 500 MG PO TABS
500.0000 mg | ORAL_TABLET | Freq: Once | ORAL | Status: AC
  Administered 2022-11-22: 500 mg via ORAL
  Filled 2022-11-22: qty 1

## 2022-11-22 NOTE — ED Notes (Signed)
Dc instructions reviewed with pt no questions or concerns at this time.  

## 2022-11-22 NOTE — Discharge Instructions (Addendum)
Please use ice, Tylenol, ibuprofen, for pain control.  If you have difficulty breathing, swelling of your chest, or one of your arms or legs please return to the ER.

## 2022-11-22 NOTE — ED Provider Notes (Signed)
[place] DEPT Provider Note   CSN: [id number] Arrival date & time: 11/22/22  1516     History  Chief Complaint  Patient presents with   Chest Injury    Stacey Nguyen is a 22 y.o. female, no pertinent past medical history, who presents to the ED secondary to left under the breast pain after lifting a TV about 2 days ago.  She states that it feels achy, and hurts when she moves.  She denies any shortness of breath, chest wall bruising.  She has not tried anything for the pain control, and states that it hurts slightly and worse when moving.     Home Medications Prior to Admission medications   Medication Sig Start Date End Date Taking? Authorizing Provider  albuterol (VENTOLIN HFA) 108 (90 Base) MCG/ACT inhaler Inhale 1-2 puffs into the lungs every 6 (six) hours as needed for wheezing or shortness of breath. 09/20/22   Tacy Learn, PA-C  metroNIDAZOLE (FLAGYL) 500 MG tablet Take 1 tablet (500 mg total) by mouth 2 (two) times daily. 07/25/22   Schutt, [place]on Folk, PA-C  naproxen (NAPROSYN) 500 MG tablet Take 1 tablet (500 mg total) by mouth 2 (two) times daily. Patient not taking: Reported on 06/03/2022 11/26/21   Carlisle Cater, PA-C  ondansetron (ZOFRAN-ODT) 4 MG disintegrating tablet Take 1 tablet (4 mg total) by mouth every 8 (eight) hours as needed for nausea or vomiting. 06/03/22   Ezequiel Essex, MD      Allergies    Patient has no known allergies.    Review of Systems   Review of Systems  Respiratory:  Negative for shortness of breath.   Cardiovascular:  Positive for chest pain.    Physical Exam Updated Vital Signs BP 126/85 (BP Location: Left Arm)   Pulse 63   Temp 98.9 F (37.2 C) (Oral)   Resp 17   SpO2 100%  Physical Exam Vitals and nursing note reviewed.  Constitutional:      General: She is not in acute distress.    Appearance: She is well-developed.  HENT:     Head: Normocephalic and atraumatic.  Eyes:      Conjunctiva/sclera: Conjunctivae normal.  Cardiovascular:     Rate and Rhythm: Normal rate and regular rhythm.     Heart sounds: No murmur heard. Pulmonary:     Effort: Pulmonary effort is normal. No respiratory distress.     Breath sounds: Normal breath sounds.  Chest:     Chest wall: Tenderness present. No mass, lacerations, deformity, swelling, crepitus or edema.     Comments: TTP under L breast w/o crepitus, bruising or rash Abdominal:     Palpations: Abdomen is soft.     Tenderness: There is no abdominal tenderness.  Musculoskeletal:        General: No swelling.     Cervical back: Neck supple.  Skin:    General: Skin is warm and dry.     Capillary Refill: Capillary refill takes less than 2 seconds.  Neurological:     Mental Status: She is alert.  Psychiatric:        Mood and Affect: Mood normal.     ED Results / Procedures / Treatments   Labs (all labs ordered are listed, but only abnormal results are displayed) Labs Reviewed - No data to display  EKG None  Radiology No results found.  Procedures Procedures    Medications Ordered in ED Medications  naproxen (NAPROSYN) tablet 500 mg (has no  administration in time range)    ED Course/ Medical Decision Making/ A&P                           Medical Decision Making Patient is a 22 year old female, here for chest wall tenderness to palpation after lifting a TV a couple days ago.  She does not any rash, bruising, edema of the area, she states that it hurts more when she moves.  She declined any kind of chest x-ray, and she does have tenderness to palpation on exam, likely muscle strain versus costochondritis secondary to moving heavy TV.  We discussed using ice, Tylenol ibuprofen for home to help control the pain.  She voiced understanding of return precautions and was discharged home.   Final Clinical Impression(s) / ED Diagnoses Final diagnoses:  Chest wall pain    Rx / DC Orders ED Discharge Orders      None         Anwyn Kriegel, Si Gaul, PA 11/22/22 1649    Fredia Sorrow, MD 11/24/22 2045

## 2022-11-22 NOTE — ED Triage Notes (Signed)
Pt arrived via POV, c/o pain in chest after lifting heavy box. Denies any SOB. Pain worsening with mvmt.

## 2022-12-11 ENCOUNTER — Emergency Department (HOSPITAL_COMMUNITY)
Admission: EM | Admit: 2022-12-11 | Discharge: 2022-12-11 | Disposition: A | Discharge status: Home / Self Care | Payer: Medicaid Other | Attending: Emergency Medicine | Admitting: Emergency Medicine

## 2022-12-11 ENCOUNTER — Encounter (HOSPITAL_COMMUNITY): Payer: Self-pay

## 2022-12-11 DIAGNOSIS — R300 Dysuria: Secondary | ICD-10-CM | POA: Diagnosis not present

## 2022-12-11 DIAGNOSIS — Z113 Encounter for screening for infections with a predominantly sexual mode of transmission: Secondary | ICD-10-CM | POA: Insufficient documentation

## 2022-12-11 DIAGNOSIS — Z202 Contact with and (suspected) exposure to infections with a predominantly sexual mode of transmission: Secondary | ICD-10-CM

## 2022-12-11 LAB — URINALYSIS, ROUTINE W REFLEX MICROSCOPIC
Bilirubin Urine: NEGATIVE
Glucose, UA: NEGATIVE mg/dL
Hgb urine dipstick: NEGATIVE
Ketones, ur: NEGATIVE mg/dL
Leukocytes,Ua: NEGATIVE
Nitrite: NEGATIVE
Protein, ur: NEGATIVE mg/dL
Specific Gravity, Urine: 1.02 (ref 1.005–1.030)
pH: 6 (ref 5.0–8.0)

## 2022-12-11 LAB — WET PREP, GENITAL
Clue Cells Wet Prep HPF POC: NONE SEEN
Sperm: NONE SEEN
Trich, Wet Prep: NONE SEEN
WBC, Wet Prep HPF POC: <10 (ref <10)
Yeast Wet Prep HPF POC: NONE SEEN

## 2022-12-11 MED ORDER — STERILE WATER FOR INJECTION IJ SOLN
INTRAMUSCULAR | Status: AC
  Filled 2022-12-11: qty 10

## 2022-12-11 MED ORDER — CEFTRIAXONE SODIUM 1 G IJ SOLR
500.0000 mg | Freq: Once | INTRAMUSCULAR | Status: AC
  Administered 2022-12-11: 500 mg via INTRAMUSCULAR
  Filled 2022-12-11: qty 10

## 2022-12-11 MED ORDER — DOXYCYCLINE HYCLATE 100 MG PO CAPS: 100.0000 mg | ORAL_CAPSULE | Freq: Two times a day (BID) | ORAL | 0 refills | Status: DC

## 2022-12-11 MED ORDER — DOXYCYCLINE HYCLATE 100 MG PO TABS
100.0000 mg | ORAL_TABLET | Freq: Once | ORAL | Status: AC
  Administered 2022-12-11: 100 mg via ORAL
  Filled 2022-12-11: qty 1

## 2022-12-11 NOTE — ED Triage Notes (Signed)
Pt presents with c/o vaginal pain. Pt just reports that she is "uncomfortable down there". Pt really unable to express exactly what her symptoms are.

## 2022-12-11 NOTE — Discharge Instructions (Signed)
Follow-up on the results of your testing done here today on MyChart.  Please utilize instructions on this form to sign out. Please take antibiotic for the next 7 days.  You will take doxycycline twice daily. If testing is positive, please continue taking antibiotics.  If testing is negative, please discontinue use of antibiotics. Read attached guide

## 2022-12-11 NOTE — ED Provider Notes (Signed)
[facility] Provider Note   CSN: [id number] Arrival date & time: 12/11/22  1039     History  Chief Complaint  Patient presents with   Vaginal Pain    Stacey Nguyen is a 23 y.o. female with no documented medical history.  Patient presents to ED for evaluation of dysuria. Patient complains of burning on urination since today.  Denies discharge, denies fevers, denies nausea, vomiting or abdominal pain. Patient reports she is sexually active, states that there is a chance she could have an STI. Last sexually active 2 weeks ago. Patient also endorsing history of BV along with chlamydia.  Patient states just got off of her menstrual period.    Vaginal Pain Pertinent negatives include no abdominal pain.       Home Medications Prior to Admission medications   Medication Sig Start Date End Date Taking? Authorizing Provider  doxycycline (VIBRAMYCIN) 100 MG capsule Take 1 capsule (100 mg total) by mouth 2 (two) times daily. 12/11/22  Yes Azucena Cecil, PA-C  albuterol (VENTOLIN HFA) 108 (90 Base) MCG/ACT inhaler Inhale 1-2 puffs into the lungs every 6 (six) hours as needed for wheezing or shortness of breath. 09/20/22   Tacy Learn, PA-C  metroNIDAZOLE (FLAGYL) 500 MG tablet Take 1 tablet (500 mg total) by mouth 2 (two) times daily. 07/25/22   Schutt, Grafton Folk, PA-C  naproxen (NAPROSYN) 500 MG tablet Take 1 tablet (500 mg total) by mouth 2 (two) times daily. Patient not taking: Reported on 06/03/2022 11/26/21   Carlisle Cater, PA-C  ondansetron (ZOFRAN-ODT) 4 MG disintegrating tablet Take 1 tablet (4 mg total) by mouth every 8 (eight) hours as needed for nausea or vomiting. 06/03/22   Ezequiel Essex, MD      Allergies    Patient has no known allergies.    Review of Systems   Review of Systems  Constitutional:  Negative for fever.  Gastrointestinal:  Negative for abdominal pain, nausea and vomiting.  Genitourinary:   Positive for dysuria. Negative for dyspareunia, flank pain and vaginal discharge.    Physical Exam Updated Vital Signs BP (!) 128/96   Pulse (!) 56   Temp 98.1 F (36.7 C)   Resp 17   LMP 12/07/2022 (Approximate)   SpO2 100%  Physical Exam Vitals and nursing note reviewed.  Constitutional:      General: She is not in acute distress.    Appearance: Normal appearance. She is not ill-appearing, toxic-appearing or diaphoretic.  HENT:     Head: Normocephalic and atraumatic.     Nose: Nose normal. No congestion.     Mouth/Throat:     Mouth: Mucous membranes are moist.     Pharynx: Oropharynx is clear.  Eyes:     Extraocular Movements: Extraocular movements intact.     Conjunctiva/sclera: Conjunctivae normal.     Pupils: Pupils are equal, round, and reactive to light.  Cardiovascular:     Rate and Rhythm: Normal rate and regular rhythm.  Pulmonary:     Effort: Pulmonary effort is normal.     Breath sounds: Normal breath sounds. No wheezing.  Abdominal:     General: Abdomen is flat. Bowel sounds are normal.     Palpations: Abdomen is soft.     Tenderness: There is no abdominal tenderness.  Musculoskeletal:     Cervical back: Normal range of motion and neck supple. No tenderness.  Skin:    General: Skin is warm and dry.  Capillary Refill: Capillary refill takes less than 2 seconds.  Neurological:     Mental Status: She is alert and oriented to person, place, and time.     ED Results / Procedures / Treatments   Labs (all labs ordered are listed, but only abnormal results are displayed) Labs Reviewed  URINALYSIS, ROUTINE W REFLEX MICROSCOPIC - Abnormal; Notable for the following components:      Result Value   APPearance HAZY (*)    All other components within normal limits  WET PREP, GENITAL  GC/CHLAMYDIA PROBE AMP ([place]) NOT AT [facility]    EKG None  Radiology No results found.  Procedures Procedures    Medications Ordered in ED Medications   cefTRIAXone (ROCEPHIN) injection 500 mg (has no administration in time range)  doxycycline (VIBRA-TABS) tablet 100 mg (has no administration in time range)    ED Course/ Medical Decision Making/ A&P  Medical Decision Making Amount and/or Complexity of Data Reviewed Labs: ordered.  Risk Prescription drug management.   [age] female presents for evaluation of dysuria, burning on urination.  Please see HPI for further details.  On examination patient afebrile and nontachycardic.  Patient lung sounds are clear bilaterally, she is not hypoxic on room air.  Patient abdomen is soft and compressible throughout.  Patient neurological examination shows no focal neurodeficits.  Patient deferred GU examination.  Patient urinalysis unremarkable.  Patient wet prep unremarkable.  Patient reports that the symptoms she is experiencing today are similar to her past instances of chlamydia.  Patient will be empirically treated with 500 mg ceftriaxone IM shot.  Patient will be sent home on 7 days of doxycycline twice daily.  Patient will receive her first dose here in the department.  Patient advised to follow-up on results of the testing done here today on MyChart.  Patient also encouraged to utilize pelvic rest, Vaseline to bilateral labia minora majora.  Patient advised that this could be vaginitis however due to history, feelings similar to past chlamydia infection we will treat empirically.  Patient provided return precautions.  Patient advised to follow-up with PCP.  Patient discharged in stable condition.   Final Clinical Impression(s) / ED Diagnoses Final diagnoses:  Dysuria  Encounter for assessment of STD exposure    Rx / DC Orders ED Discharge Orders          Ordered    doxycycline (VIBRAMYCIN) 100 MG capsule  2 times daily        12/11/22 2132              Lawana Chambers 12/11/22 2132    Fransico Meadow, MD 12/14/22 [phone]

## 2022-12-11 NOTE — ED Notes (Signed)
Wet Prep and GC/C recollected and sent to lab. JRPRN

## 2022-12-11 NOTE — ED Provider Triage Note (Signed)
Emergency Medicine Provider Triage Evaluation Note  Stacey Nguyen , a 22 y.o. female  was evaluated in triage.  Pt complains of burning on urination since today.  Denies discharge, denies fevers, denies nausea, vomiting or abdominal pain.  Patient reports she is sexually active, states that there is a chance she could have an STI.  Patient also endorsing history of BV.  Patient states just got off of her menstrual period.  Review of Systems  Positive:  Negative:   Physical Exam  BP 112/79 (BP Location: Left Arm)   Pulse 64   Temp 98.1 F (36.7 C) (Oral)   Resp 17   LMP 12/07/2022 (Approximate)   SpO2 99%  Gen:   Awake, no distress   Resp:  Normal effort  MSK:   Moves extremities without difficulty  Other:    Medical Decision Making  Medically screening exam initiated at 12:53 PM.  Appropriate orders placed.  Stacey Nguyen was informed that the remainder of the evaluation will be completed by another provider, this initial triage assessment does not replace that evaluation, and the importance of remaining in the ED until their evaluation is complete.     Stacey Cecil, PA-C 12/11/22 1254

## 2022-12-12 LAB — GC/CHLAMYDIA PROBE AMP (~~LOC~~) NOT AT ARMC
Chlamydia: NEGATIVE
Chlamydia: NEGATIVE
Comment: NEGATIVE
Comment: NORMAL
Comment: NEGATIVE
Comment: NORMAL
Neisseria Gonorrhea: NEGATIVE
Neisseria Gonorrhea: NEGATIVE

## 2023-02-15 IMAGING — CR DG CHEST 2V
2 series · 2 of 2 positions shown · non-contrast
Comparison: None.

CLINICAL DATA: Shortness of breath

EXAM:
CHEST - 2 VIEW

[w chest pa]
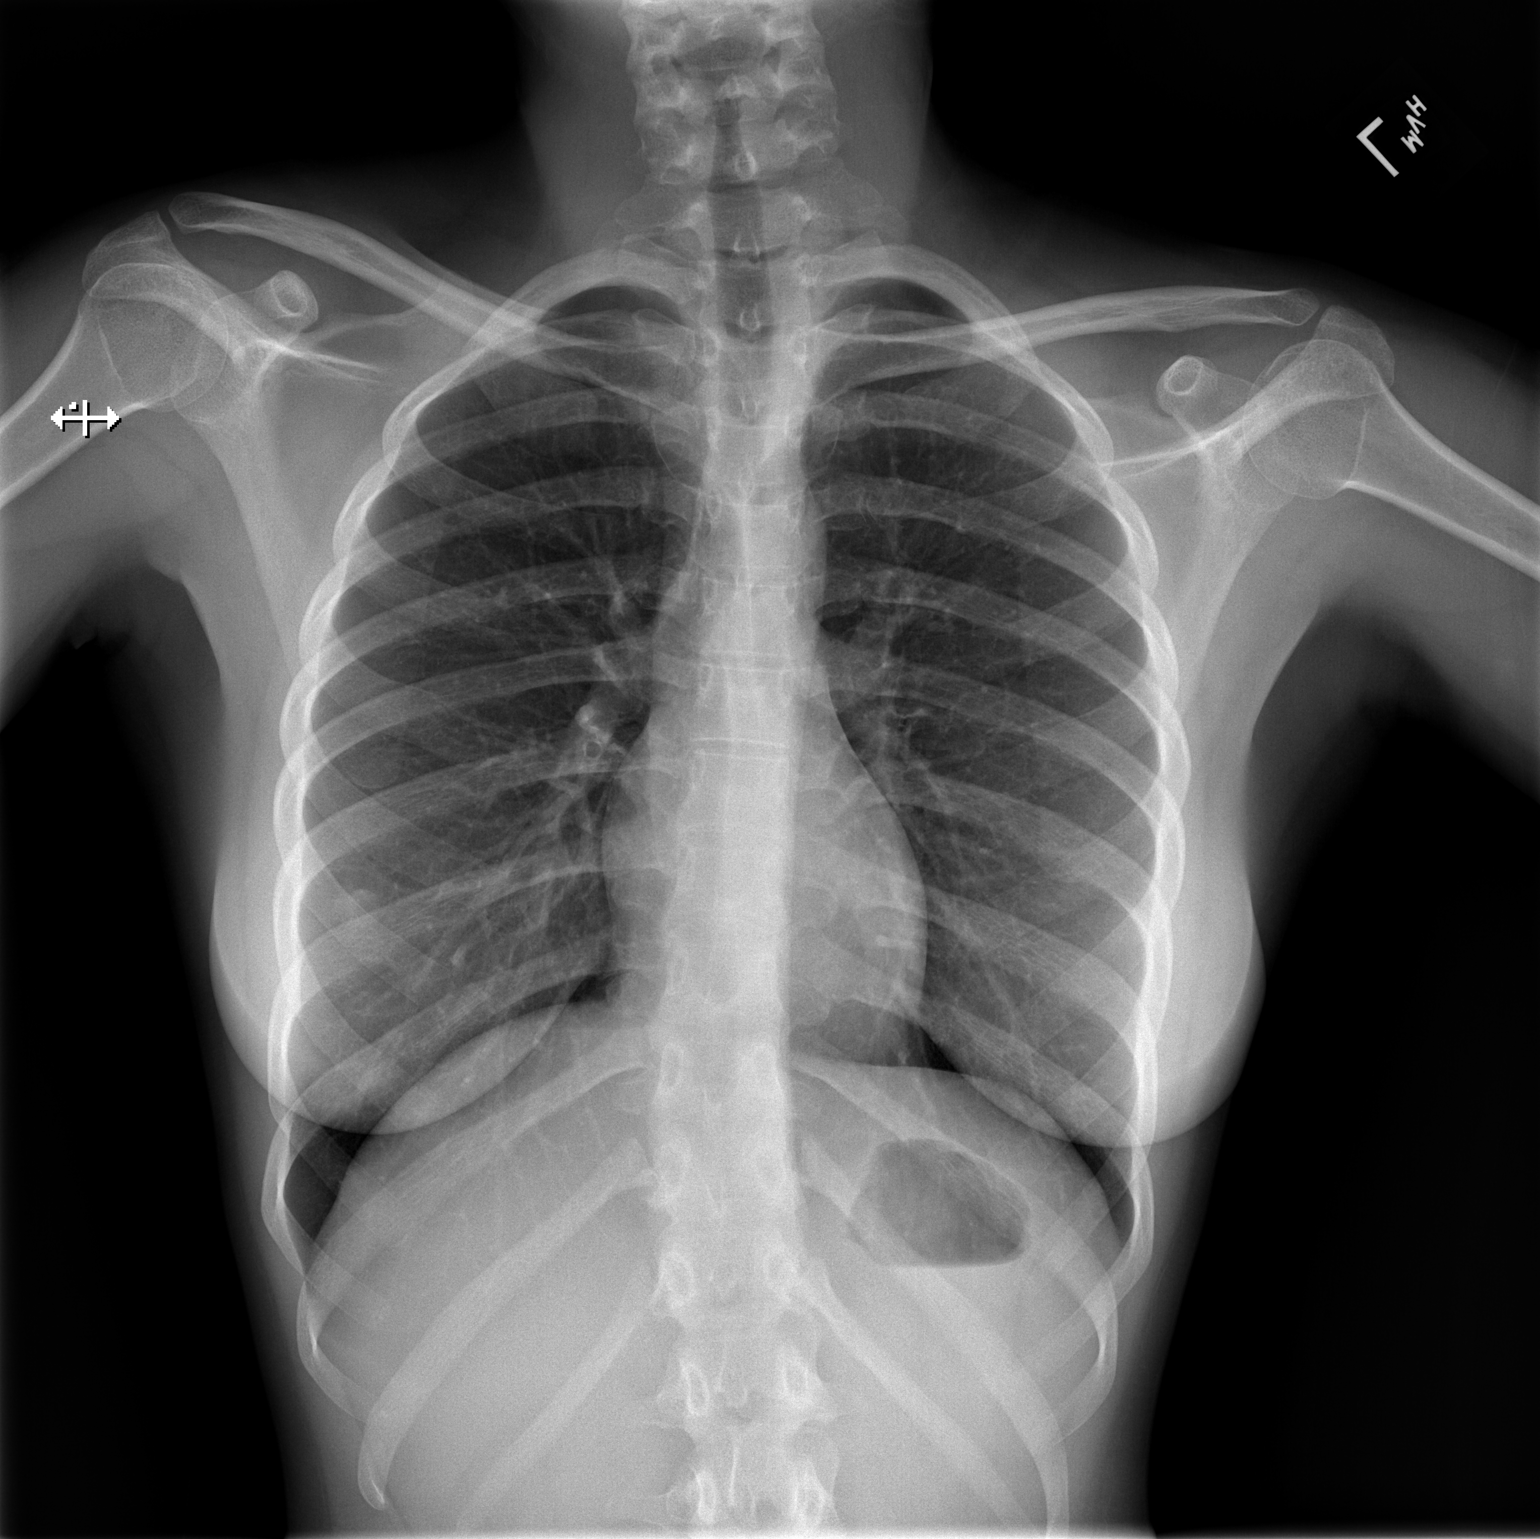

[w chest lat]
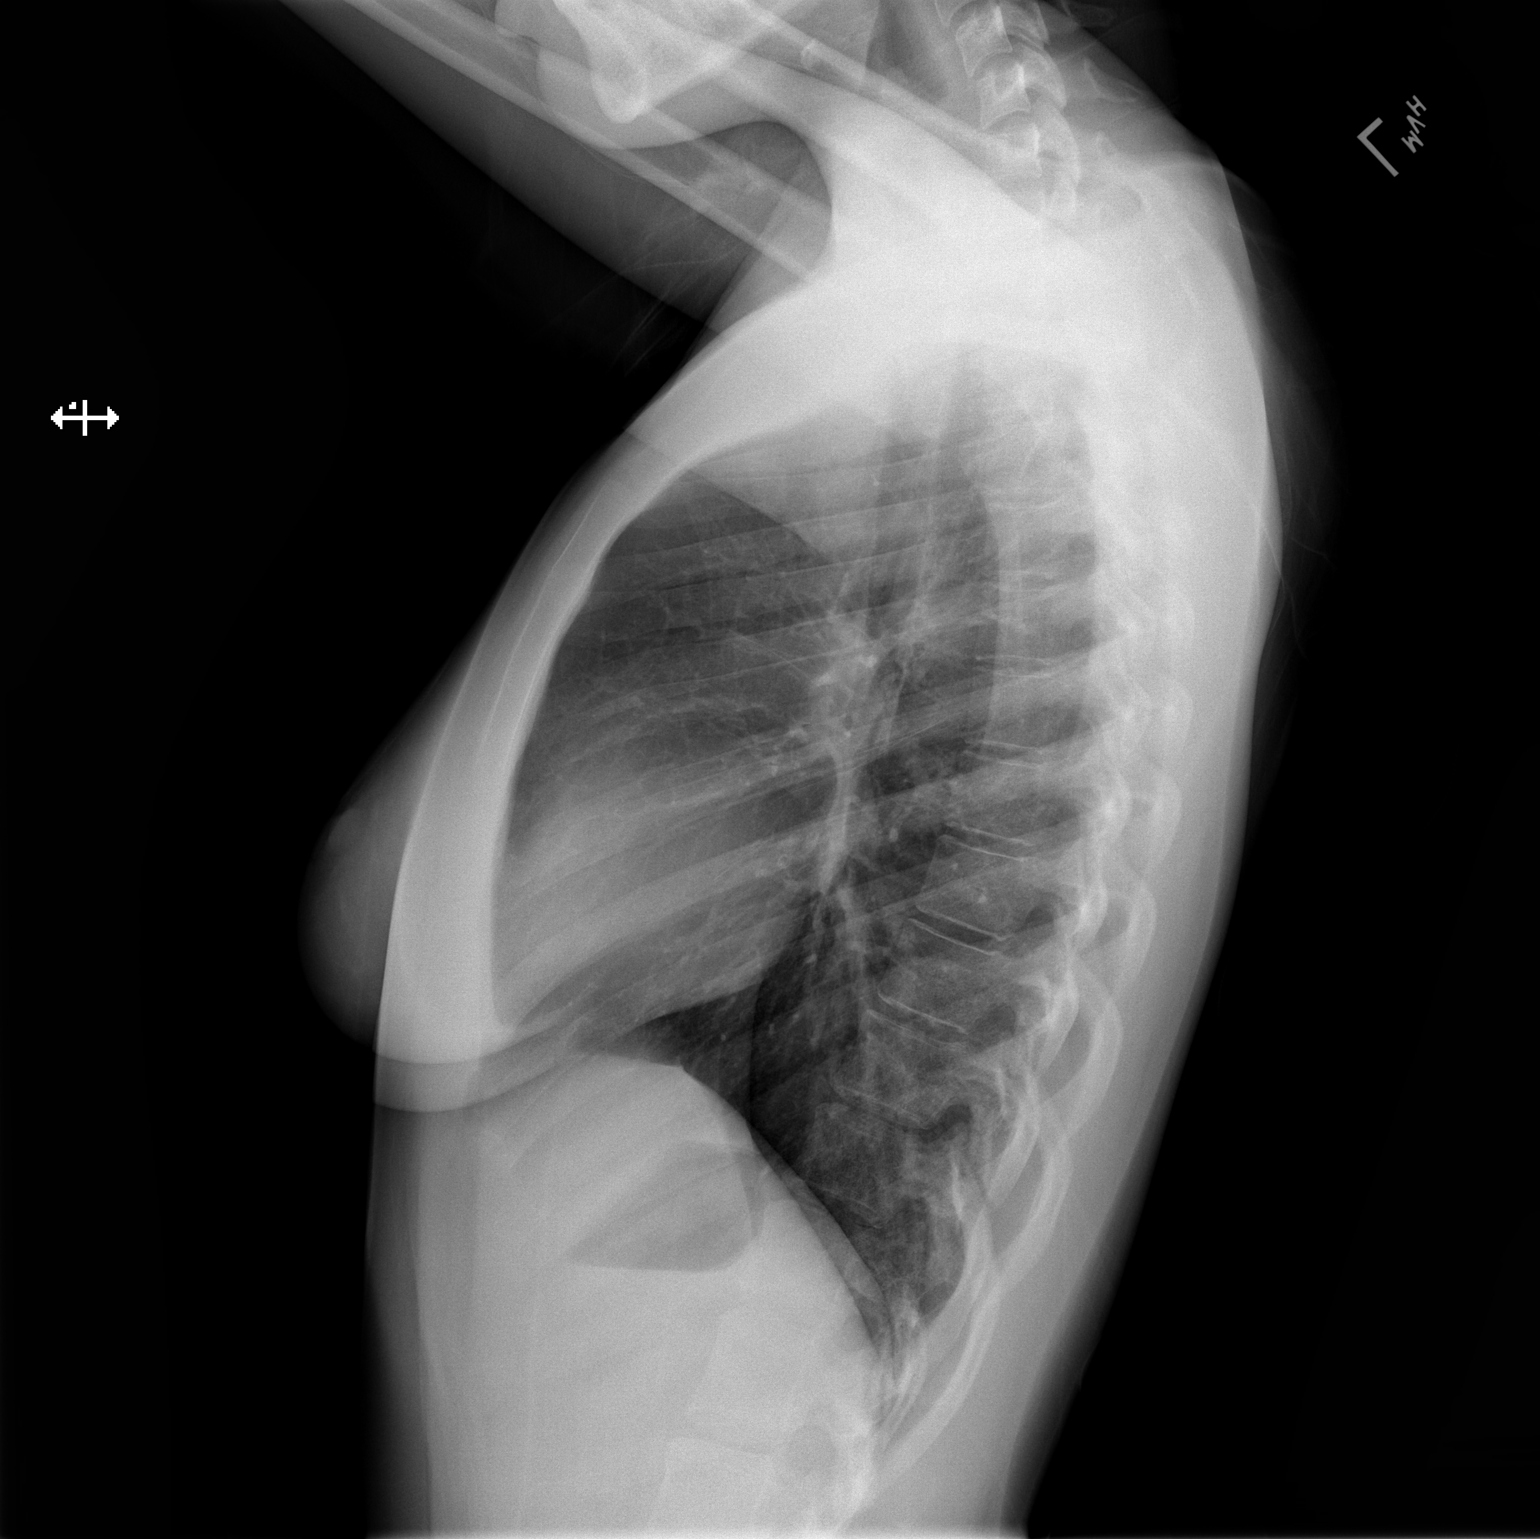

[2 of 2 positions shown; findings below may reference images not displayed]

FINDINGS: The heart size and mediastinal contours are within normal limits.
Both lungs are clear. The visualized skeletal structures are
unremarkable.
IMPRESSION: No active cardiopulmonary disease.

## 2023-02-15 IMAGING — CT CT RENAL STONE PROTOCOL
2 of 4 series · 16 of 46 positions shown, 18 images · non-contrast
Comparison: None.

CLINICAL DATA: Flank pain, kidney stone suspected

EXAM:
CT ABDOMEN AND PELVIS WITHOUT CONTRAST
TECHNIQUE: Multidetector CT imaging of the abdomen and pelvis was performed
following the standard protocol without IV contrast.

[Series 2: axial st · axial · 0.63mm/px · z∈[-471,-96]mm · 13 of 85 slices shown, 15 images]
[im 5/85  soft-tissue]
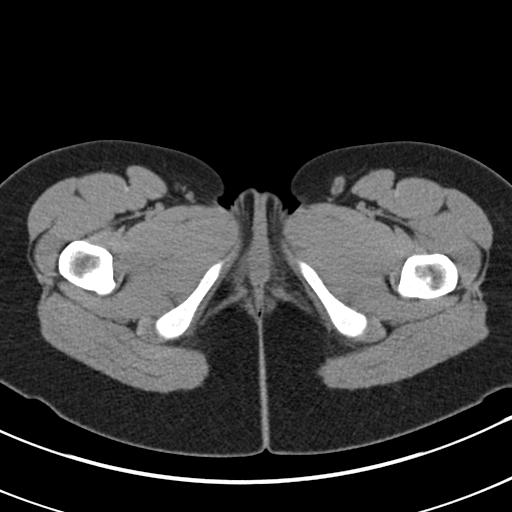
[im 5/85  bone]
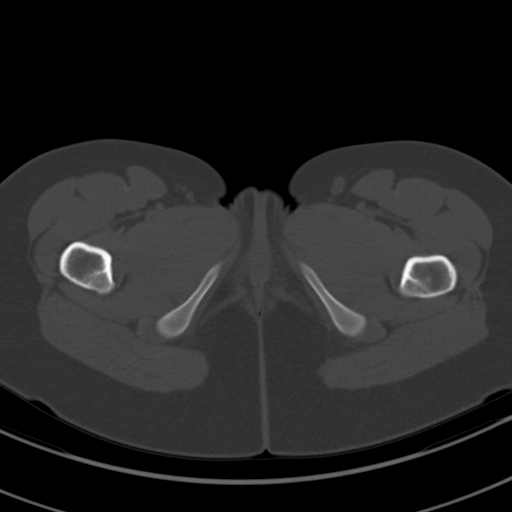
[im 14/85  soft-tissue]
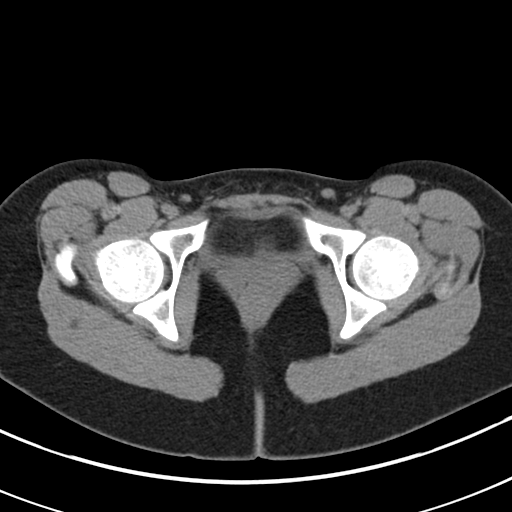
[im 18/85  soft-tissue]
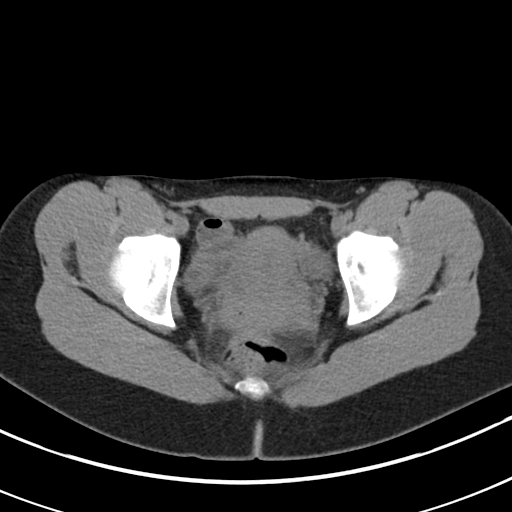
[im 23/85  soft-tissue]
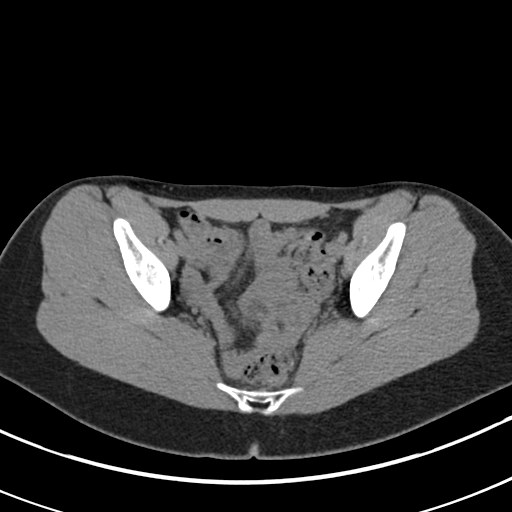
[im 31/85  soft-tissue]
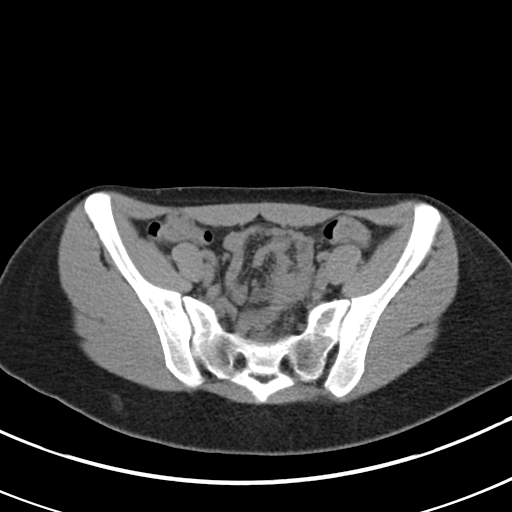
[im 36/85  soft-tissue]
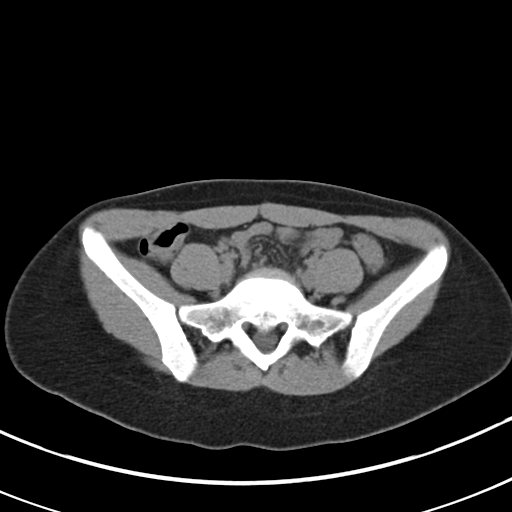
[im 45/85  soft-tissue]
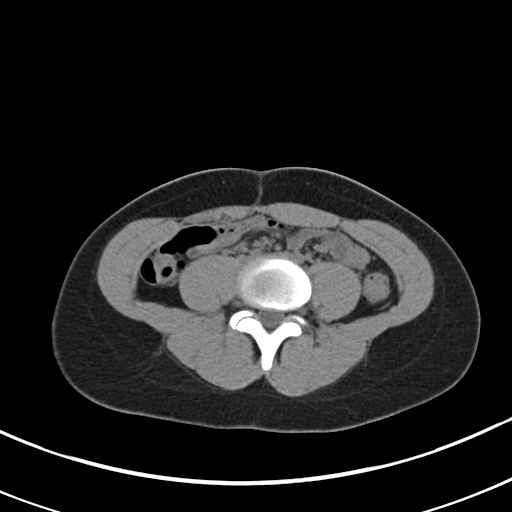
[im 49/85  soft-tissue]
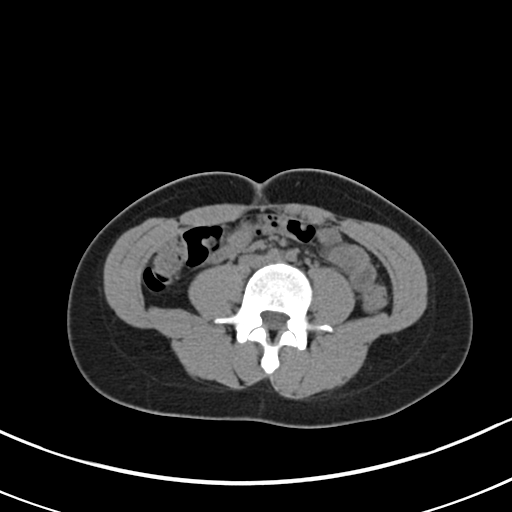
[im 54/85  soft-tissue]
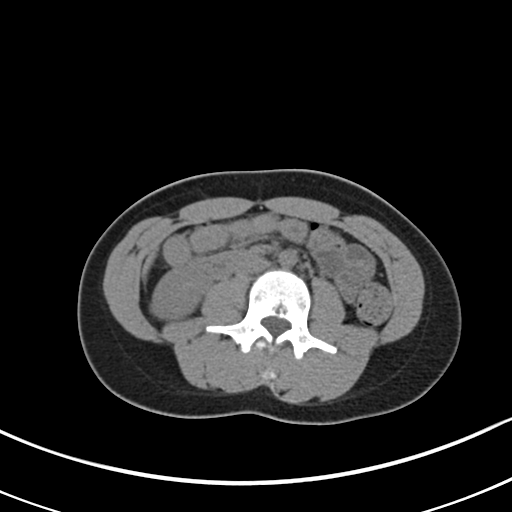
[im 54/85  bone]
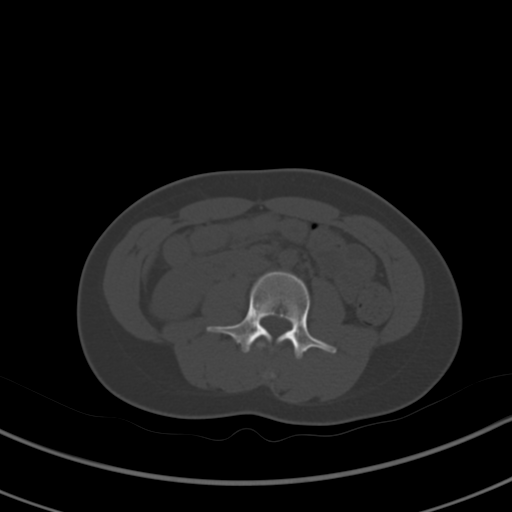
[im 62/85  soft-tissue]
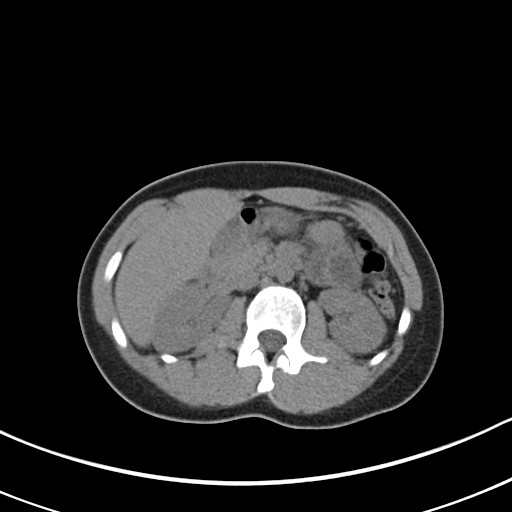
[im 67/85  soft-tissue]
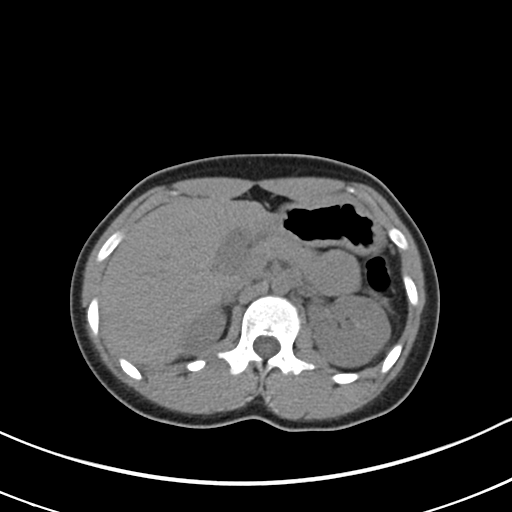
[im 71/85  soft-tissue]
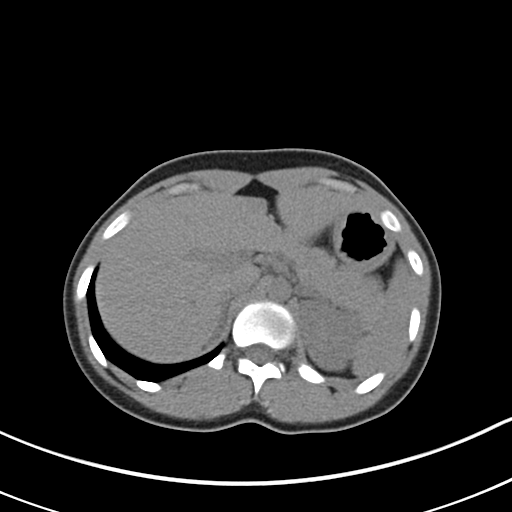
[im 80/85  soft-tissue]
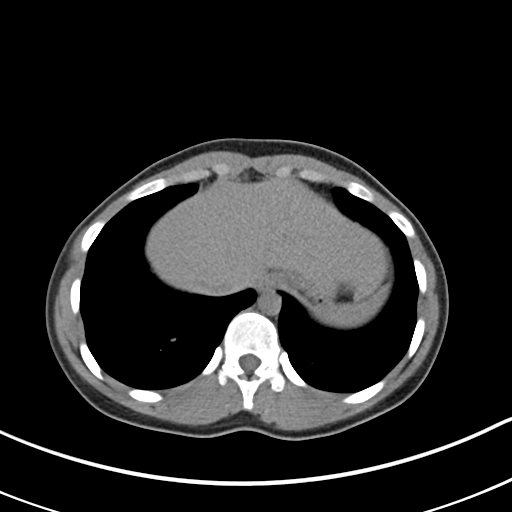

[Series 4: coronal · coronal · 0.66mm/px · 3 of 106 slices shown]
[im 36/106  soft-tissue]
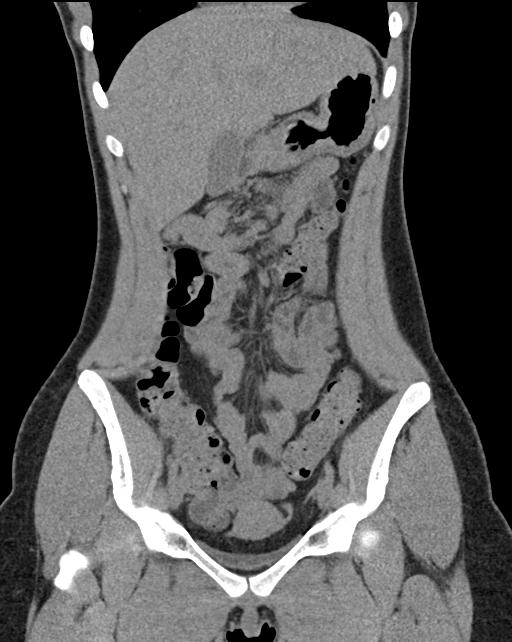
[im 47/106  soft-tissue]
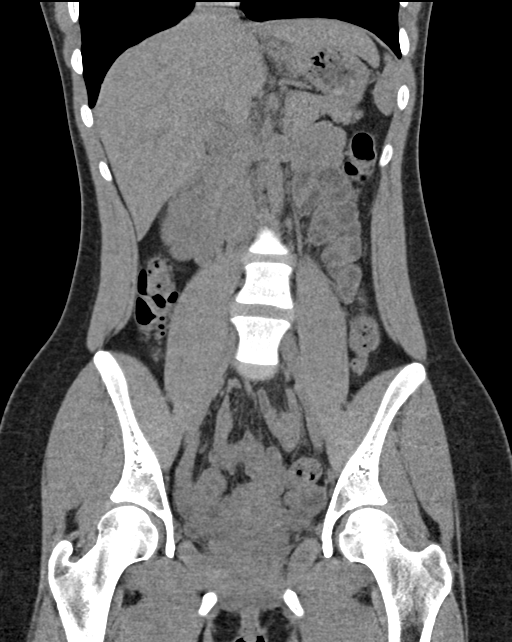
[im 59/106  soft-tissue]
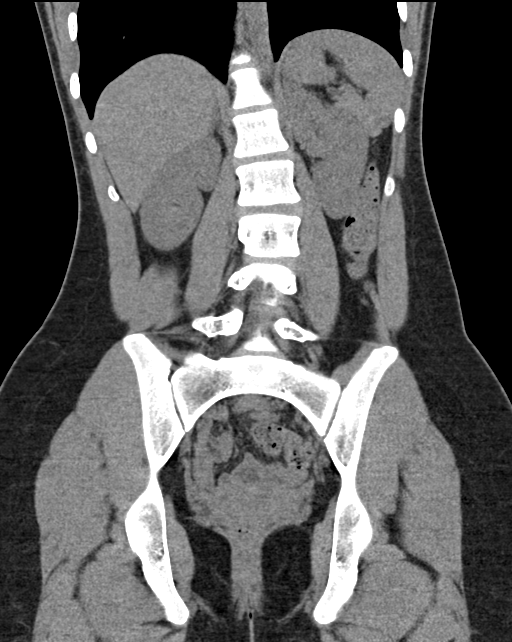

[16 of 46 positions shown; findings below may reference images not displayed]

FINDINGS: Lower chest: No acute abnormality.

Hepatobiliary: No focal liver abnormality. No gallstones,
gallbladder wall thickening, or pericholecystic fluid. No biliary
dilatation.

Pancreas: No focal lesion. Normal pancreatic contour. No surrounding
inflammatory changes. No main pancreatic ductal dilatation.

Spleen: Normal in size without focal abnormality.

Adrenals/Urinary Tract:

No adrenal nodule bilaterally.

No nephrolithiasis and no hydronephrosis. No definite
contour-deforming renal mass.

No ureterolithiasis or hydroureter.

The urinary bladder is unremarkable.

Stomach/Bowel: Stomach is within normal limits. No evidence of bowel
wall thickening or dilatation. Appendix appears normal.

Vascular/Lymphatic: No abdominal aorta or iliac aneurysm. No
abdominal, pelvic, or inguinal lymphadenopathy.

Reproductive: Uterus and bilateral adnexa are unremarkable.

Other: No intraperitoneal free fluid. No intraperitoneal free gas.
No organized fluid collection.

Musculoskeletal:

No abdominal wall hernia or abnormality.

No suspicious lytic or blastic osseous lesions. No acute displaced
fracture.
IMPRESSION: No acute intra-abdominal or intrapelvic abnormality with limited
evaluation on this noncontrast study.

## 2023-06-27 ENCOUNTER — Emergency Department (HOSPITAL_COMMUNITY)
Admission: EM | Admit: 2023-06-27 | Discharge: 2023-06-27 | Disposition: A | Discharge status: Home / Self Care | Payer: Medicaid Other | Attending: Emergency Medicine | Admitting: Emergency Medicine

## 2023-06-27 ENCOUNTER — Emergency Department (HOSPITAL_COMMUNITY): Payer: Medicaid Other

## 2023-06-27 ENCOUNTER — Encounter (HOSPITAL_COMMUNITY): Payer: Self-pay

## 2023-06-27 ENCOUNTER — Other Ambulatory Visit: Payer: Self-pay

## 2023-06-27 DIAGNOSIS — R0789 Other chest pain: Secondary | ICD-10-CM | POA: Diagnosis not present

## 2023-06-27 DIAGNOSIS — Z87891 Personal history of nicotine dependence: Secondary | ICD-10-CM | POA: Insufficient documentation

## 2023-06-27 DIAGNOSIS — R079 Chest pain, unspecified: Secondary | ICD-10-CM | POA: Diagnosis present

## 2023-06-27 LAB — TROPONIN I (HIGH SENSITIVITY): Troponin I (High Sensitivity): <2 ng/L (ref <18)

## 2023-06-27 LAB — CBC
HCT: 40.4 % (ref 36.0–46.0)
Hemoglobin: 12.8 g/dL (ref 12.0–15.0)
MCH: 29.1 pg (ref 26.0–34.0)
MCHC: 31.7 g/dL (ref 30.0–36.0)
MCV: 91.8 fL (ref 80.0–100.0)
Platelets: 245 10*3/uL (ref 150–400)
RBC: 4.4 MIL/uL (ref 3.87–5.11)
RDW: 12.5 % (ref 11.5–15.5)
WBC: 5.3 10*3/uL (ref 4.0–10.5)
nRBC: 0 % (ref 0.0–0.2)

## 2023-06-27 LAB — BASIC METABOLIC PANEL
Anion gap: 10 (ref 5–15)
BUN: 9 mg/dL (ref 6–20)
CO2: 22 mmol/L (ref 22–32)
Calcium: 9.3 mg/dL (ref 8.9–10.3)
Chloride: 103 mmol/L (ref 98–111)
Creatinine, Ser: 0.82 mg/dL (ref 0.44–1.00)
GFR, Estimated: >60 mL/min (ref >60)
Glucose, Bld: 84 mg/dL (ref 70–99)
Potassium: 3.8 mmol/L (ref 3.5–5.1)
Sodium: 135 mmol/L (ref 135–145)

## 2023-06-27 LAB — URINALYSIS, ROUTINE W REFLEX MICROSCOPIC
Bilirubin Urine: NEGATIVE
Glucose, UA: NEGATIVE mg/dL
Hgb urine dipstick: NEGATIVE
Ketones, ur: 20 mg/dL — AB
Leukocytes,Ua: NEGATIVE
Nitrite: NEGATIVE
Protein, ur: NEGATIVE mg/dL
Specific Gravity, Urine: 1.027 (ref 1.005–1.030)
pH: 5 (ref 5.0–8.0)

## 2023-06-27 LAB — HCG, SERUM, QUALITATIVE: Preg, Serum: NEGATIVE

## 2023-06-27 MED ORDER — ONDANSETRON 4 MG PO TBDP: 4.0000 mg | ORAL_TABLET | Freq: Three times a day (TID) | ORAL | 0 refills | Status: AC | PRN

## 2023-06-27 MED ORDER — NAPROXEN 500 MG PO TABS
500.0000 mg | ORAL_TABLET | Freq: Once | ORAL | Status: AC
  Administered 2023-06-27: 500 mg via ORAL
  Filled 2023-06-27: qty 1

## 2023-06-27 MED ORDER — NAPROXEN 500 MG PO TABS: 500.0000 mg | ORAL_TABLET | Freq: Two times a day (BID) | ORAL | 0 refills | Status: AC

## 2023-06-27 NOTE — ED Provider Notes (Signed)
[place] EMERGENCY DEPARTMENT AT [facility] Provider Note   CSN: [id number] Arrival date & time: 06/27/23  1333     History  Chief Complaint  Patient presents with   Chest Pain   Abdominal Pain    Stacey Nguyen is a 22 y.o. female with a history of scoliosis who presents to the ED today for multiple concerns.  Patient reports pain at the right side of her chest that started earlier today and ultimately went away on its own.  The pain was worse with movement and did not radiate.  No associated shortness of breath.  Additionally, patient has been having upper back pain for the past several days.  She reports 1 episode of right shoulder pain yesterday without radiation down the arm, numbness, or weakness.  She denies any injury or trauma.  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 Lastly, patient reports an episode of abdominal pain that occurred yesterday.  Patient reports that she smoked and then she developed pain in the right lower quadrant with associated nausea.  This episode went away on its own without any medication.  She denies any fever, vomiting, or diarrhea.  No weakness or loss of rosacea.    Home Medications Prior to Admission medications   Medication Sig Start Date End Date Taking? Authorizing Provider  naproxen (NAPROSYN) 500 MG tablet Take 1 tablet (500 mg total) by mouth 2 (two) times daily. 06/27/23 07/27/23 Yes Maxwell Marion, PA-C  ondansetron (ZOFRAN-ODT) 4 MG disintegrating tablet Take 1 tablet (4 mg total) by mouth every 8 (eight) hours as needed for nausea or vomiting. 06/27/23 07/27/23 Yes Maxwell Marion, PA-C  albuterol (VENTOLIN HFA) 108 (90 Base) MCG/ACT inhaler Inhale 1-2 puffs into the lungs every 6 (six) hours as needed for wheezing or shortness of breath. 09/20/22   Jeannie Fend, PA-C  doxycycline (VIBRAMYCIN) 100 MG capsule Take 1 capsule (100 mg total) by mouth 2 (two) times daily.  12/11/22   Al Decant, PA-C  metroNIDAZOLE (FLAGYL) 500 MG tablet Take 1 tablet (500 mg total) by mouth 2 (two) times daily. 07/25/22   Schutt, Edsel Petrin, PA-C      Allergies    Patient has no known allergies.    Review of Systems   Review of Systems  Cardiovascular:  Positive for chest pain.  Gastrointestinal:  Positive for abdominal pain.  All other systems reviewed and are negative.   Physical Exam Updated Vital Signs BP 138/88   Pulse 82   Temp 98.4 F (36.9 C) (Oral)   Resp 18   Ht 5\' 3"  (1.6 m)   Wt 56.7 kg   LMP 05/29/2023 (Exact Date) Comment: per patient, no chance of pregnancy, patient was shielded for xray 06/27/23  SpO2 98%   BMI 22.14 kg/m  Physical Exam Vitals and nursing note reviewed.  Constitutional:      General: She is not in acute distress.    Appearance: Normal appearance.  HENT:     Head: Normocephalic and atraumatic.     Mouth/Throat:     Mouth: Mucous membranes are moist.  Eyes:     Conjunctiva/sclera: Conjunctivae normal.     Pupils: Pupils are equal, round, and reactive to light.  Cardiovascular:     Rate and Rhythm: Normal rate and regular rhythm.     Pulses: Normal pulses.     Heart sounds: Normal heart sounds.  Pulmonary:     Effort: Pulmonary  effort is normal.     Breath sounds: Normal breath sounds.  Abdominal:     Palpations: Abdomen is soft.     Tenderness: There is no abdominal tenderness. There is no right CVA tenderness or left CVA tenderness.  Musculoskeletal:        General: Tenderness present. Normal range of motion.     Cervical back: Normal range of motion.     Comments: Tenderness to palpation of thoracic spine without step off or deformity.  Skin:    General: Skin is warm and dry.     Findings: No rash.  Neurological:     General: No focal deficit present.     Mental Status: She is alert.     Sensory: No sensory deficit.     Motor: No weakness.  Psychiatric:        Mood and Affect: Mood normal.         Behavior: Behavior normal.     ED Results / Procedures / Treatments   Labs (all labs ordered are listed, but only abnormal results are displayed) Labs Reviewed  URINALYSIS, ROUTINE W REFLEX MICROSCOPIC - Abnormal; Notable for the following components:      Result Value   APPearance HAZY (*)    Ketones, ur 20 (*)    All other components within normal limits  BASIC METABOLIC PANEL  CBC  HCG, SERUM, QUALITATIVE  TROPONIN I (HIGH SENSITIVITY)  TROPONIN I (HIGH SENSITIVITY)    EKG EKG Interpretation Date/Time:  Wednesday June 27 2023 13:42:55 EDT Ventricular Rate:  74 PR Interval:  99 QRS Duration:  76 QT Interval:  367 QTC Calculation: 408 R Axis:   84  Text Interpretation: Sinus arrhythmia Short PR interval Right atrial enlargement no significant change since Jan 2024 Confirmed by Pricilla Loveless [phone]) on 06/27/2023 3:29:47 PM  Radiology DG Chest 2 View  Result Date: 06/27/2023 CLINICAL DATA:  Chest pain beginning 3 days ago EXAM: CHEST - 2 VIEW COMPARISON:  05/29/2022 FINDINGS: Midline trachea.  Normal heart size and mediastinal contours. Sharp costophrenic angles. No pneumothorax. Clear lungs. Mild S shaped thoracolumbar spine curvature. IMPRESSION: No active cardiopulmonary disease. Electronically Signed   By: Jeronimo Greaves M.D.   On: 06/27/2023 15:09    Procedures Procedures: not indicated.   Medications Ordered in ED Medications  naproxen (NAPROSYN) tablet 500 mg (500 mg Oral Given 06/27/23 1610)    ED Course/ Medical Decision Making/ A&P                                 Medical Decision Making Amount and/or Complexity of Data Reviewed Labs: ordered. Radiology: ordered.  Risk Prescription drug management.   This patient presents to the ED for concern of chest pain, this involves an extensive number of treatment options, and is a complaint that carries with it a high risk of complications and morbidity.   Differential diagnosis includes: ACS, pneumonia,  pneumothorax, anxiety, costochondritis, pleurisy, URI, etc.   Comorbidities  See HPI above   Additional History  Additional history obtained from patient's previous records.   Cardiac Monitoring / EKG  The patient was maintained on a cardiac monitor.  I personally viewed and interpreted the cardiac monitored which showed: sinus rhythm with a shortened PR interval, heart rate of 74 bpm.   Lab Tests  I ordered and personally interpreted labs.  The pertinent results include:   Negative troponin BMP and CBC are unremarkable -  no electrolyte abnormalities, AKI, anemia, or infection UA is within normal limits - no signs of infection Negative pregnancy test   Imaging Studies  Chest x-ray was ordered and interpreted: no active cardiopulmonary disease. No imaging of the abdomen was ordered. Patient not currently having abdominal pain and has to leave for work.   Problem List / ED Course / Critical Interventions / Medication Management  Chest pain, right shoulder pain, upper back pain I ordered medications including: Naproxen for pain  Reevaluation of the patient after these medicines showed that the patient improved I have reviewed the patients home medicines and have made adjustments as needed   Social Determinants of Health  Tobacco use   Test / Admission - Considered  Discussed results with patient. She is hemodynamically stable and safe for discharge home. Referral for primary care provided. Prescriptions for Zofran and Naproxen sent to the pharmacy. Return precautions provided.       Final Clinical Impression(s) / ED Diagnoses Final diagnoses:  Atypical chest pain    Rx / DC Orders ED Discharge Orders          Ordered    naproxen (NAPROSYN) 500 MG tablet  2 times daily        06/27/23 1639    Ambulatory referral to [facility]        06/27/23 1639    ondansetron (ZOFRAN-ODT) 4 MG disintegrating tablet  Every 8 hours PRN        06/27/23 1645               Maxwell Marion, PA-C 06/27/23 2326    Pricilla Loveless, MD 06/28/23 [phone]

## 2023-06-27 NOTE — ED Triage Notes (Signed)
Pt began having intermittent abd and back pain that began apprx 3 days ago. Since then pt has also started to experience pain in the right shoulder and pain on the right side of her chest. Pt denies any N/V/D.

## 2023-06-27 NOTE — Discharge Instructions (Addendum)
As discussed, you are not having any injury to your heart muscle indicating a heart attack. Your labs do not indicate any acute infection. I have sent a prescription to Naproxen to your pharmacy to take as needed for pain as well as Zofran to take as needed for nausea. I have sent a referral to family medicine for you to get established and they will call you in the next several days to make an appointment.  Get help right away if: You feel sick to your stomach (nauseous) or you throw up (vomit). You feel sweaty or light-headed. You have a cough with mucus from your lungs (sputum) or you cough up blood. You are short of breath.

## 2023-11-06 ENCOUNTER — Emergency Department (HOSPITAL_COMMUNITY)
Admission: EM | Admit: 2023-11-06 | Discharge: 2023-11-06 | Disposition: A | Discharge status: Home / Self Care | Payer: Medicaid Other | Attending: Student | Admitting: Student

## 2023-11-06 ENCOUNTER — Other Ambulatory Visit: Payer: Self-pay

## 2023-11-06 ENCOUNTER — Encounter (HOSPITAL_COMMUNITY): Payer: Self-pay

## 2023-11-06 DIAGNOSIS — N760 Acute vaginitis: Secondary | ICD-10-CM | POA: Insufficient documentation

## 2023-11-06 DIAGNOSIS — Z202 Contact with and (suspected) exposure to infections with a predominantly sexual mode of transmission: Secondary | ICD-10-CM | POA: Diagnosis present

## 2023-11-06 LAB — WET PREP, GENITAL
Sperm: NONE SEEN
Trich, Wet Prep: NONE SEEN
WBC, Wet Prep HPF POC: <10 (ref <10)
Yeast Wet Prep HPF POC: NONE SEEN

## 2023-11-06 LAB — URINALYSIS, ROUTINE W REFLEX MICROSCOPIC
Bacteria, UA: NONE SEEN
Bilirubin Urine: NEGATIVE
Glucose, UA: NEGATIVE mg/dL
Hgb urine dipstick: NEGATIVE
Ketones, ur: NEGATIVE mg/dL
Leukocytes,Ua: NEGATIVE
Nitrite: NEGATIVE
Protein, ur: NEGATIVE mg/dL
Specific Gravity, Urine: 1.021 (ref 1.005–1.030)
pH: 7 (ref 5.0–8.0)

## 2023-11-06 LAB — GC/CHLAMYDIA PROBE AMP (~~LOC~~) NOT AT ARMC
Chlamydia: NEGATIVE
Comment: NEGATIVE
Comment: NORMAL
Neisseria Gonorrhea: NEGATIVE

## 2023-11-06 LAB — PREGNANCY, URINE: Preg Test, Ur: NEGATIVE

## 2023-11-06 MED ORDER — METRONIDAZOLE 500 MG PO TABS: 500.0000 mg | ORAL_TABLET | Freq: Two times a day (BID) | ORAL | 0 refills | Status: DC

## 2023-11-06 NOTE — ED Provider Notes (Signed)
[place] EMERGENCY DEPARTMENT AT [facility] Provider Note   CSN: [id number] Arrival date & time: 11/06/23  4098     History  Chief Complaint  Patient presents with   Exposure to STD    Stacey Nguyen is a 22 y.o. female.  Patient presents to the emergency room requesting STI testing.  Patient states that her partner recently tested positive for trichomonas.  She currently denies any symptoms including vaginal discharge, vaginal bleeding, urinary symptoms.  Past medical history noncontributory   Exposure to STD       Home Medications Prior to Admission medications   Medication Sig Start Date End Date Taking? Authorizing Provider  metroNIDAZOLE (FLAGYL) 500 MG tablet Take 1 tablet (500 mg total) by mouth 2 (two) times daily. 11/06/23  Yes Darrick Grinder, PA-C  albuterol (VENTOLIN HFA) 108 (90 Base) MCG/ACT inhaler Inhale 1-2 puffs into the lungs every 6 (six) hours as needed for wheezing or shortness of breath. 09/20/22   Jeannie Fend, PA-C  doxycycline (VIBRAMYCIN) 100 MG capsule Take 1 capsule (100 mg total) by mouth 2 (two) times daily. 12/11/22   Al Decant, PA-C      Allergies    Patient has no known allergies.    Review of Systems   Review of Systems  Physical Exam Updated Vital Signs BP (!) 138/90   Pulse 78   Temp 98.4 F (36.9 C) (Oral)   Resp 18   Ht 5\' 3"  (1.6 m)   Wt 55.8 kg   LMP 11/01/2023 (Exact Date)   SpO2 100%   BMI 21.79 kg/m  Physical Exam Vitals and nursing note reviewed.  HENT:     Head: Normocephalic and atraumatic.  Eyes:     Conjunctiva/sclera: Conjunctivae normal.  Pulmonary:     Effort: Pulmonary effort is normal. No respiratory distress.  Genitourinary:    Comments: Deferred Musculoskeletal:        General: No signs of injury.     Cervical back: Normal range of motion.  Skin:    General: Skin is dry.  Neurological:     Mental Status: She is alert.  Psychiatric:        Speech: Speech  normal.        Behavior: Behavior normal.     ED Results / Procedures / Treatments   Labs (all labs ordered are listed, but only abnormal results are displayed) Labs Reviewed  WET PREP, GENITAL - Abnormal; Notable for the following components:      Result Value   Clue Cells Wet Prep HPF POC PRESENT (*)    All other components within normal limits  URINALYSIS, ROUTINE W REFLEX MICROSCOPIC  POC URINE PREG, ED  GC/CHLAMYDIA PROBE AMP ([place]) NOT AT [facility]    EKG None  Radiology No results found.  Procedures Procedures    Medications Ordered in ED Medications - No data to display  ED Course/ Medical Decision Making/ A&P                                 Medical Decision Making Amount and/or Complexity of Data Reviewed Labs: ordered.   This patient presents to the ED for concern of STI exposure   Co morbidities that complicate the patient evaluation  None   Lab Tests:  I Ordered, and personally interpreted labs.  The pertinent results include: Wet prep positive for clue cells, negative for trichomonas  Imaging Studies ordered:  There is no indication for imaging at this time   Social Determinants of Health:  Patient has Medicaid for her primary health insurance type and is a daily tobacco user   Test / Admission - Considered:  Discussed bacterial vaginosis with the patient.  Also discussed the negative trichomonas test.  Patient did request treatment for bacterial vaginosis even though she is grossly asymptomatic.  This does seem reasonable.  Will prescribe course of metronidazole for patient.  Gonorrhea chlamydia testing pending at time of discharge.  Patient understands that if she does test positive for either of these she will need further treatment but as patient is currently asymptomatic I do not feel that treatment prior to positive results is warranted.  Discharge home at this time.         Final Clinical Impression(s) / ED  Diagnoses Final diagnoses:  Bacterial vaginosis    Rx / DC Orders ED Discharge Orders          Ordered    metroNIDAZOLE (FLAGYL) 500 MG tablet  2 times daily        11/06/23 0158              Pamala Duffel 11/06/23 0158    Glendora Score, MD 11/06/23 [phone]

## 2023-11-06 NOTE — Discharge Instructions (Addendum)
Please take the medication as prescribed.  If you develop any life-threatening symptoms return to the emergency department.  Please follow MyChart for results of gonorrhea/chlamydia testing.  If positive you will need further antibiotics.

## 2023-11-06 NOTE — ED Triage Notes (Signed)
Pt reports that her partner told her that he has trich so pt would like to be tested and  treated for same

## 2024-01-06 ENCOUNTER — Other Ambulatory Visit: Payer: Self-pay

## 2024-01-06 ENCOUNTER — Emergency Department (HOSPITAL_COMMUNITY)
Admission: EM | Admit: 2024-01-06 | Discharge: 2024-01-06 | Disposition: A | Discharge status: Home / Self Care | Payer: Medicaid Other | Attending: Emergency Medicine | Admitting: Emergency Medicine

## 2024-01-06 DIAGNOSIS — Z20822 Contact with and (suspected) exposure to covid-19: Secondary | ICD-10-CM | POA: Insufficient documentation

## 2024-01-06 DIAGNOSIS — R110 Nausea: Secondary | ICD-10-CM | POA: Diagnosis not present

## 2024-01-06 DIAGNOSIS — R519 Headache, unspecified: Secondary | ICD-10-CM | POA: Diagnosis not present

## 2024-01-06 DIAGNOSIS — J029 Acute pharyngitis, unspecified: Secondary | ICD-10-CM | POA: Insufficient documentation

## 2024-01-06 LAB — RESP PANEL BY RT-PCR (RSV, FLU A&B, COVID)  RVPGX2
Influenza A by PCR: NEGATIVE
Influenza B by PCR: NEGATIVE
Resp Syncytial Virus by PCR: NEGATIVE
SARS Coronavirus 2 by RT PCR: NEGATIVE

## 2024-01-06 LAB — GROUP A STREP BY PCR: Group A Strep by PCR: NOT DETECTED

## 2024-01-06 MED ORDER — ACETAMINOPHEN 325 MG PO TABS
650.0000 mg | ORAL_TABLET | Freq: Once | ORAL | Status: AC
  Administered 2024-01-06: 650 mg via ORAL
  Filled 2024-01-06: qty 2

## 2024-01-06 MED ORDER — ONDANSETRON 4 MG PO TBDP: 4.0000 mg | ORAL_TABLET | Freq: Three times a day (TID) | ORAL | 0 refills | Status: AC | PRN

## 2024-01-06 NOTE — ED Provider Notes (Signed)
 [place] EMERGENCY DEPARTMENT AT [facility] Provider Note   CSN: [id number] Arrival date & time: 01/06/24  1510     History  Chief Complaint  Patient presents with   Sore Throat   Headache    Stacey Nguyen is a 23 y.o. female with no pertinent past medical history presents to emergency department for evaluation of sore throat, headache, nausea, sneezing, decreased appetite for the past 3 days.  She describes her headache in the frontal region 4/10 without visual disturbances.  She denies vomiting, diarrhea, fevers, chills, known sick contacts   Sore Throat Associated symptoms include headaches. Pertinent negatives include no chest pain, no abdominal pain and no shortness of breath.  Headache Associated symptoms: sore throat   Associated symptoms: no abdominal pain, no cough, no diarrhea, no dizziness, no fatigue, no fever, no nausea, no numbness, no seizures, no vomiting and no weakness        Home Medications Prior to Admission medications   Medication Sig Start Date End Date Taking? Authorizing Provider  ondansetron (ZOFRAN-ODT) 4 MG disintegrating tablet Take 1 tablet (4 mg total) by mouth every 8 (eight) hours as needed for nausea or vomiting. 01/06/24  Yes Judithann Sheen, PA  albuterol (VENTOLIN HFA) 108 (90 Base) MCG/ACT inhaler Inhale 1-2 puffs into the lungs every 6 (six) hours as needed for wheezing or shortness of breath. 09/20/22   Jeannie Fend, PA-C  doxycycline (VIBRAMYCIN) 100 MG capsule Take 1 capsule (100 mg total) by mouth 2 (two) times daily. 12/11/22   Al Decant, PA-C  metroNIDAZOLE (FLAGYL) 500 MG tablet Take 1 tablet (500 mg total) by mouth 2 (two) times daily. 11/06/23   Darrick Grinder, PA-C      Allergies    Patient has no known allergies.    Review of Systems   Review of Systems  Constitutional:  Negative for chills, fatigue and fever.  HENT:  Positive for sneezing and sore throat.   Respiratory:  Negative for  cough, chest tightness, shortness of breath and wheezing.   Cardiovascular:  Negative for chest pain and palpitations.  Gastrointestinal:  Negative for abdominal pain, constipation, diarrhea, nausea and vomiting.  Neurological:  Positive for headaches. Negative for dizziness, seizures, weakness, light-headedness and numbness.    Physical Exam Updated Vital Signs BP (!) 134/97 (BP Location: Right Arm)   Pulse 96   Temp 99.8 F (37.7 C) (Oral)   Resp 18   Ht 5\' 3"  (1.6 m)   Wt 56.7 kg   LMP 12/03/2023   SpO2 100%   BMI 22.14 kg/m  Physical Exam Vitals and nursing note reviewed.  Constitutional:      General: She is not in acute distress.    Appearance: Normal appearance. She is not ill-appearing.  HENT:     Head: Normocephalic and atraumatic.     Comments: No submandibular swelling nor tenderness.  No difficulty swallowing nor maintaining secretions    Right Ear: Hearing, tympanic membrane, ear canal and external ear normal. No middle ear effusion.     Left Ear: Hearing, tympanic membrane, ear canal and external ear normal.  No middle ear effusion.     Ears:     Comments: No mastoid or tragal tenderness    Mouth/Throat:     Mouth: Mucous membranes are moist.     Pharynx: No oropharyngeal exudate or posterior oropharyngeal erythema.     Comments: Uvula midline. No abscess, fluctuance, erythema noted in mouth Eyes:  General: Vision grossly intact. No scleral icterus.       Right eye: No discharge.        Left eye: No discharge.     Extraocular Movements:     Right eye: Normal extraocular motion and no nystagmus.     Left eye: Normal extraocular motion and no nystagmus.     Conjunctiva/sclera: Conjunctivae normal.  Cardiovascular:     Rate and Rhythm: Normal rate.     Pulses: Normal pulses.     Comments: No tachycardia Pulmonary:     Effort: Pulmonary effort is normal. No respiratory distress.     Breath sounds: Normal breath sounds. No stridor. No wheezing or rhonchi.   Chest:     Chest wall: No tenderness.  Abdominal:     General: There is no distension.     Palpations: Abdomen is soft. There is no mass.     Tenderness: There is no abdominal tenderness. There is no guarding.  Musculoskeletal:     Cervical back: Normal range of motion and neck supple. No rigidity or tenderness.  Lymphadenopathy:     Cervical: No cervical adenopathy.  Skin:    General: Skin is warm.     Capillary Refill: Capillary refill takes less than 2 seconds.     Coloration: Skin is not jaundiced or pale.  Neurological:     Mental Status: She is alert and oriented to person, place, and time. Mental status is at baseline.     Cranial Nerves: No cranial nerve deficit.     Sensory: No sensory deficit.     Motor: No weakness.     Coordination: Coordination normal.     Gait: Gait normal.     ED Results / Procedures / Treatments   Labs (all labs ordered are listed, but only abnormal results are displayed) Labs Reviewed  GROUP A STREP BY PCR  RESP PANEL BY RT-PCR (RSV, FLU A&B, COVID)  [id number]    EKG None  Radiology No results found.  Procedures Procedures    Medications Ordered in ED Medications  acetaminophen (TYLENOL) tablet 650 mg (has no administration in time range)    ED Course/ Medical Decision Making/ A&P                                 Medical Decision Making Risk Prescription drug management.   Patient presents to the ED for concern of sore throat, headache, decreased appetite, nausea, this involves an extensive number of treatment options, and is a complaint that carries with it a high risk of complications and morbidity.  The differential diagnosis includes COVID, flu, RSV, viral URI, dehydration.  Less likely pneumonia   Co morbidities that complicate the patient evaluation  None   Additional history obtained:  Additional history obtained from Nursing   External records from outside source obtained and reviewed including triage RN  note   Lab Tests:  I Ordered, and personally interpreted labs.  The pertinent results include:   Strep negative Respiratory panel pending    Medicines ordered and prescription drug management:  I ordered medication including tylenol  for HA  Reevaluation of the patient after these medicines showed that the patient improved I have reviewed the patients home medicines and have made adjustments as needed    Problem List / ED Course:  Sore throat Nausea Strep negative.  Tonsils 1+ equal bilaterally.  No alarm signs. Will provide prescription for Zofran  and encourage oral hydration Respiratory panel pending.  She does not need to stay for the results.  I discussed despite results of this she needs to ensure that she has adequate hydration Discussed physical exam findings, return to emergency department cautions with patient expressed understanding with the plan.  All questions answered to her satisfaction.  She is agreeable discharge.  Charge instructions provided   Reevaluation:  After the interventions noted above, I reevaluated the patient and found that they have :improved   Social Determinants of Health:  No PCP.  Provided CH and wellness as a recommendation   Dispostion:  After consideration of the diagnostic results and the patients response to treatment, I feel that the patent would benefit from outpatient management with symptomatic care.    Final Clinical Impression(s) / ED Diagnoses Final diagnoses:  None    Rx / DC Orders ED Discharge Orders          Ordered    ondansetron (ZOFRAN-ODT) 4 MG disintegrating tablet  Every 8 hours PRN        01/06/24 1616              Judithann Sheen, PA 01/06/24 1624    Virgina Norfolk, DO 01/06/24 1756

## 2024-01-06 NOTE — Discharge Instructions (Signed)
 Thank you for letting us evaluate you today.  Your strep is negative.  Your respiratory panel is pending.  You may check the results of these on your MyChart.  Regardless you need to ensure you have adequate oral hydration (Gatorade, Pedialyte, low sugar juices, chicken broth).  I have sent Zofran to your pharmacy to use as needed for nausea.  Tylenol and ibuprofen intermittently every 4 hours for muscle aches and/or fever  Return to emergency department if you experience swelling or extreme tenderness to area under jaw, significant worsening of symptoms.  Otherwise, please establish primary care with [place] [facility] for routine medical complaints and annual visits

## 2024-01-06 NOTE — ED Triage Notes (Signed)
 Patient to ED by POV with c/o flu like symptoms x3 days. She voices sore throat, chest pain and headaches.

## 2024-01-09 ENCOUNTER — Encounter (HOSPITAL_COMMUNITY): Payer: Self-pay

## 2024-01-09 ENCOUNTER — Emergency Department (HOSPITAL_COMMUNITY)
Admission: EM | Admit: 2024-01-09 | Discharge: 2024-01-10 | Disposition: A | Discharge status: Home / Self Care | Payer: Medicaid Other | Attending: Emergency Medicine | Admitting: Emergency Medicine

## 2024-01-09 ENCOUNTER — Other Ambulatory Visit: Payer: Self-pay

## 2024-01-09 DIAGNOSIS — N898 Other specified noninflammatory disorders of vagina: Secondary | ICD-10-CM | POA: Insufficient documentation

## 2024-01-09 DIAGNOSIS — Z202 Contact with and (suspected) exposure to infections with a predominantly sexual mode of transmission: Secondary | ICD-10-CM | POA: Diagnosis present

## 2024-01-09 DIAGNOSIS — J029 Acute pharyngitis, unspecified: Secondary | ICD-10-CM | POA: Insufficient documentation

## 2024-01-09 LAB — WET PREP, GENITAL
Clue Cells Wet Prep HPF POC: NONE SEEN
Sperm: NONE SEEN
Trich, Wet Prep: NONE SEEN
WBC, Wet Prep HPF POC: >=10 — AB (ref <10)
Yeast Wet Prep HPF POC: NONE SEEN

## 2024-01-09 LAB — URINALYSIS, ROUTINE W REFLEX MICROSCOPIC
Bilirubin Urine: NEGATIVE
Glucose, UA: NEGATIVE mg/dL
Hgb urine dipstick: NEGATIVE
Ketones, ur: 80 mg/dL — AB
Leukocytes,Ua: NEGATIVE
Nitrite: NEGATIVE
Protein, ur: 30 mg/dL — AB
Specific Gravity, Urine: 1.025 (ref 1.005–1.030)
pH: 5 (ref 5.0–8.0)

## 2024-01-09 NOTE — ED Triage Notes (Signed)
 Pt reports having a vaginal discharge and odor x 5 days. Pt complains of sore throat as well.

## 2024-01-10 LAB — GC/CHLAMYDIA PROBE AMP (~~LOC~~) NOT AT ARMC
Chlamydia: NEGATIVE
Comment: NEGATIVE
Comment: NORMAL
Neisseria Gonorrhea: NEGATIVE

## 2024-01-10 LAB — PREGNANCY, URINE: Preg Test, Ur: NEGATIVE

## 2024-01-10 MED ORDER — DOXYCYCLINE HYCLATE 100 MG PO CAPS: 100.0000 mg | ORAL_CAPSULE | Freq: Two times a day (BID) | ORAL | 0 refills | Status: DC

## 2024-01-10 MED ORDER — STERILE WATER FOR INJECTION IJ SOLN
INTRAMUSCULAR | Status: AC
  Filled 2024-01-10: qty 10

## 2024-01-10 MED ORDER — METRONIDAZOLE 500 MG PO TABS: 500.0000 mg | ORAL_TABLET | Freq: Two times a day (BID) | ORAL | 0 refills | Status: DC

## 2024-01-10 MED ORDER — FLUCONAZOLE 150 MG PO TABS
150.0000 mg | ORAL_TABLET | Freq: Every day | ORAL | 0 refills | Status: DC
Start: 2024-01-10 — End: 2024-01-10

## 2024-01-10 MED ORDER — CEFTRIAXONE SODIUM 1 G IJ SOLR
500.0000 mg | Freq: Once | INTRAMUSCULAR | Status: AC
  Administered 2024-01-10: 500 mg via INTRAMUSCULAR
  Filled 2024-01-10: qty 10

## 2024-01-10 MED ORDER — FLUCONAZOLE 150 MG PO TABS: 150.0000 mg | ORAL_TABLET | Freq: Every day | ORAL | 0 refills | Status: DC

## 2024-01-10 NOTE — ED Provider Notes (Signed)
 [facility]ENCY DEPT [facility] Emergency Department Provider Note MRN:  [id number]  Arrival date & time: 01/10/24     Chief Complaint   Exposure to STD   History of Present Illness   Stacey Nguyen is a 23 y.o. year-old female presents to the ED with chief complaint of concern for exposure to STD.  She states that she has had some vaginal discharge.  She states that she has also had flu like symptoms and sore throat.  States she was recently tested for the same, but tests were negative.  History provided by patient.   Review of Systems  Pertinent positive and negative review of systems noted in HPI.    Physical Exam   Vitals:   01/09/24 2203 01/10/24 0158  BP: 124/86 130/86  Pulse: 74 85  Resp: 19 18  Temp: 98.4 F (36.9 C) 98.7 F (37.1 C)  SpO2: 100% 100%    CONSTITUTIONAL:  non toxic-appearing, NAD NEURO:  Alert and oriented x 3, CN 3-12 grossly intact EYES:  eyes equal and reactive ENT/NECK:  Supple, no stridor  CARDIO:  normal rate, appears well-perfused  PULM:  No respiratory distress,  GI/GU:  non-distended,  MSK/SPINE:  No gross deformities, no edema, moves all extremities  SKIN:  no rash, atraumatic   *Additional and/or pertinent findings included in MDM below  Diagnostic and Interventional Summary    EKG Interpretation Date/Time:    Ventricular Rate:    PR Interval:    QRS Duration:    QT Interval:    QTC Calculation:   R Axis:      Text Interpretation:         Labs Reviewed  WET PREP, GENITAL - Abnormal; Notable for the following components:      Result Value   WBC, Wet Prep HPF POC >=10 (*)    All other components within normal limits  URINALYSIS, ROUTINE W REFLEX MICROSCOPIC - Abnormal; Notable for the following components:   APPearance HAZY (*)    Ketones, ur 80 (*)    Protein, ur 30 (*)    Bacteria, UA RARE (*)    All other components within normal limits  PREGNANCY, URINE  GC/CHLAMYDIA PROBE AMP ([place]) NOT AT  [facility]    No orders to display    Medications  sterile water (preservative free) injection (has no administration in time range)  cefTRIAXone (ROCEPHIN) injection 500 mg (500 mg Intramuscular Given 01/10/24 0302)     Procedures  /  Critical Care Procedures  ED Course and Medical Decision Making  I have reviewed the triage vital signs, the nursing notes, and pertinent available records from the EMR.  Social Determinants Affecting Complexity of Care: Patient has no clinically significant social determinants affecting this chief complaint..   ED Course:    Medical Decision Making Patient here with vaginal discharge.  She is concerned about STD.  I will offer her prophylactic treatment.  Vital signs are stable.  She does not appear toxic.  Recommend outpatient follow-up.  Amount and/or Complexity of Data Reviewed Labs: ordered.  Risk Prescription drug management.         Consultants: No consultations were needed in caring for this patient.   Treatment and Plan: Emergency department workup does not suggest an emergent condition requiring admission or immediate intervention beyond  what has been performed at this time. The patient is safe for discharge and has  been instructed to return immediately for worsening symptoms, change in  symptoms or any other  concerns    Final Clinical Impressions(s) / ED Diagnoses     ICD-10-CM   1. Possible exposure to STD  Z20.2     2. Vaginal discharge  N89.8       ED Discharge Orders          Ordered    doxycycline (VIBRAMYCIN) 100 MG capsule  2 times daily        01/10/24 0258    fluconazole (DIFLUCAN) 150 MG tablet  Daily        01/10/24 0258    metroNIDAZOLE (FLAGYL) 500 MG tablet  2 times daily        01/10/24 0258              Discharge Instructions Discussed with and Provided to Patient:   Discharge Instructions   None      Roxy Horseman, PA-C 01/10/24 4098    Palumbo, April, MD 01/10/24 [phone]

## 2024-01-10 NOTE — ED Notes (Addendum)
 Pt. Called to triage, no answer X 3.

## 2024-02-18 ENCOUNTER — Other Ambulatory Visit: Payer: Self-pay

## 2024-02-18 ENCOUNTER — Encounter (HOSPITAL_COMMUNITY): Payer: Self-pay

## 2024-02-18 ENCOUNTER — Emergency Department (HOSPITAL_COMMUNITY)
Admission: EM | Admit: 2024-02-18 | Discharge: 2024-02-18 | Disposition: A | Discharge status: Home / Self Care | Attending: Emergency Medicine | Admitting: Emergency Medicine

## 2024-02-18 DIAGNOSIS — Z202 Contact with and (suspected) exposure to infections with a predominantly sexual mode of transmission: Secondary | ICD-10-CM | POA: Diagnosis present

## 2024-02-18 DIAGNOSIS — B9689 Other specified bacterial agents as the cause of diseases classified elsewhere: Secondary | ICD-10-CM | POA: Diagnosis not present

## 2024-02-18 DIAGNOSIS — F419 Anxiety disorder, unspecified: Secondary | ICD-10-CM | POA: Diagnosis not present

## 2024-02-18 DIAGNOSIS — N760 Acute vaginitis: Secondary | ICD-10-CM | POA: Diagnosis not present

## 2024-02-18 LAB — WET PREP, GENITAL
Sperm: NONE SEEN
Trich, Wet Prep: NONE SEEN
WBC, Wet Prep HPF POC: <10 (ref <10)
Yeast Wet Prep HPF POC: NONE SEEN

## 2024-02-18 LAB — RAPID HIV SCREEN (HIV 1/2 AB+AG)
HIV 1/2 Antibodies: NONREACTIVE
HIV-1 P24 Antigen - HIV24: NONREACTIVE

## 2024-02-18 MED ORDER — STERILE WATER FOR INJECTION IJ SOLN
INTRAMUSCULAR | Status: AC
  Filled 2024-02-18: qty 10

## 2024-02-18 MED ORDER — CEFTRIAXONE SODIUM 1 G IJ SOLR
1.0000 g | Freq: Once | INTRAMUSCULAR | Status: AC
  Administered 2024-02-18: 1 g via INTRAMUSCULAR
  Filled 2024-02-18: qty 10

## 2024-02-18 MED ORDER — DOXYCYCLINE HYCLATE 100 MG PO CAPS: 100.0000 mg | ORAL_CAPSULE | Freq: Two times a day (BID) | ORAL | 0 refills | Status: DC

## 2024-02-18 MED ORDER — METRONIDAZOLE 500 MG PO TABS: 500.0000 mg | ORAL_TABLET | Freq: Two times a day (BID) | ORAL | 0 refills | Status: DC

## 2024-02-18 NOTE — ED Triage Notes (Signed)
 Patient had unprotected sex 3 days ago. Partner recommended patient to get checked for chlamydia as someone he was messing with tested positive. Denies vaginal drainage. Itchy and odor. Is currently on period now.

## 2024-02-18 NOTE — ED Provider Notes (Signed)
 [place] EMERGENCY DEPARTMENT AT [facility] Provider Note   CSN: [id number] Arrival date & time: 02/18/24  1519     History  Chief Complaint  Patient presents with   Exposure to STD    Adely Facer is a 23 y.o. female.   Exposure to STD  Patient is a 23 year old female presents the ED today with complaints of possible STD exposure as her partner that she is currently engaged with reports that the person before her had told him that she was positive for chlamydia.  Here for prophylactic treatment.  Is currently on her period.  Denies vaginal discharge, dysuria, dyspareunia, abdominal pain, fever, shortness of breath.     Home Medications Prior to Admission medications   Medication Sig Start Date End Date Taking? Authorizing Provider  albuterol (VENTOLIN HFA) 108 (90 Base) MCG/ACT inhaler Inhale 1-2 puffs into the lungs every 6 (six) hours as needed for wheezing or shortness of breath. 09/20/22   Jeannie Fend, PA-C  doxycycline (VIBRAMYCIN) 100 MG capsule Take 1 capsule (100 mg total) by mouth 2 (two) times daily. 02/18/24   Lunette Stands, PA-C  fluconazole (DIFLUCAN) 150 MG tablet Take 1 tablet (150 mg total) by mouth daily. 01/10/24   Roxy Horseman, PA-C  metroNIDAZOLE (FLAGYL) 500 MG tablet Take 1 tablet (500 mg total) by mouth 2 (two) times daily. 02/18/24   Lunette Stands, PA-C  ondansetron (ZOFRAN-ODT) 4 MG disintegrating tablet Take 1 tablet (4 mg total) by mouth every 8 (eight) hours as needed for nausea or vomiting. 01/06/24   Judithann Sheen, PA      Allergies    Patient has no known allergies.    Review of Systems   Review of Systems  Genitourinary:        STI exposure  All other systems reviewed and are negative.   Physical Exam Updated Vital Signs BP (!) 132/91   Pulse 65   Temp 98.4 F (36.9 C) (Oral)   Resp 18   Ht 5\' 3"  (1.6 m)   Wt 55.8 kg   SpO2 100%   BMI 21.79 kg/m  Physical Exam Vitals and nursing note reviewed.   Constitutional:      General: She is not in acute distress.    Appearance: Normal appearance. She is not ill-appearing.  HENT:     Head: Normocephalic and atraumatic.  Eyes:     General: No scleral icterus.       Right eye: No discharge.        Left eye: No discharge.     Extraocular Movements: Extraocular movements intact.     Conjunctiva/sclera: Conjunctivae normal.  Cardiovascular:     Rate and Rhythm: Normal rate and regular rhythm.     Pulses: Normal pulses.     Heart sounds: Normal heart sounds. No murmur heard.    No friction rub. No gallop.  Pulmonary:     Effort: Pulmonary effort is normal. No respiratory distress.     Breath sounds: Normal breath sounds.  Abdominal:     General: Abdomen is flat. There is no distension.     Palpations: Abdomen is soft.     Tenderness: There is no abdominal tenderness. There is no right CVA tenderness, left CVA tenderness, guarding or rebound.  Musculoskeletal:     Right lower leg: No edema.     Left lower leg: No edema.  Skin:    General: Skin is warm and dry.     Findings:  No bruising or erythema.  Neurological:     General: No focal deficit present.     Mental Status: She is alert and oriented to person, place, and time. Mental status is at baseline.     Motor: No weakness.  Psychiatric:     Comments: Patient appears anxious     ED Results / Procedures / Treatments   Labs (all labs ordered are listed, but only abnormal results are displayed) Labs Reviewed  WET PREP, GENITAL - Abnormal; Notable for the following components:      Result Value   Clue Cells Wet Prep HPF POC PRESENT (*)    All other components within normal limits  RAPID HIV SCREEN (HIV 1/2 AB+AG)  RPR  GC/CHLAMYDIA PROBE AMP ([place]) NOT AT [facility]    EKG None  Radiology No results found.  Procedures Procedures    Medications Ordered in ED Medications  cefTRIAXone (ROCEPHIN) injection 1 g (1 g Intramuscular Given 02/18/24 1741)  sterile water  (preservative free) injection (  Given 02/18/24 1746)    ED Course/ Medical Decision Making/ A&P                                 Medical Decision Making Amount and/or Complexity of Data Reviewed Labs: ordered.  Risk Prescription drug management.   Patient is a 24 year old female presents the ED today with complaints of possible STD exposure as her partner that she is currently engaged with reports that the person before her had told him that she was positive for chlamydia.  Here for prophylactic treatment.  Is currently on her period.    On physical exam, patient is in no acute stress, afebrile, speaking in full sentences.  Vital signs are normal.  No abdominal pain to palpation.  Patient deferred GU exam and wished to self swab.  Unremarkable physical exam otherwise.  Patient was notably seen in February for STI exposure and treated with Rocephin, doxycycline, Triazole, fluconazole.  STI check labs were drawn and patient was provided 1 g of ceftriaxone here.  Will prescribe outpatient doxycycline.  Wet prep was notable for clue cells, patient will be sent metronidazole to the pharmacy to treat back to vaginosis.  No signs of PID or any other systemic infections at this time.  I have low suspicion for any other emergency processes at this time.  I believe patient safe to be discharged at this time.  All questions were answered.  Recommended she continue safe sex practices as well as to continue medications until finished.  Differential diagnoses prior to evaluation: The emergent differential diagnosis includes, but is not limited to, STI, PID, UTI. This is not an exhaustive differential.   Past Medical History / Co-morbidities / Social History: No chronic past medical history on file   Lab Tests/Imaging studies: I personally interpreted labs/imaging and the pertinent results include:      Medications: I ordered medication including ceftriaxone and doxycycline.  I have reviewed the  patients home medicines and have made adjustments as needed.  Social Determinants of Health: Sexual partner was noted to be exposed to chlamydia and alerted her to this .  Disposition: After consideration of the diagnostic results and the patients response to treatment, I feel that the patient would benefit from discharge and treatment as above.   emergency department workup does not suggest an emergent condition requiring admission or immediate intervention beyond what has been performed at this  time. The plan is: Ceftriaxone provided here, doxycycline outpatient, return for any new or worsening symptoms, labs still pending patient can check these labs and will be addressed if labs are positive otherwise. The patient is safe for discharge and has been instructed to return immediately for worsening symptoms, change in symptoms or any other concerns.   Final Clinical Impression(s) / ED Diagnoses Final diagnoses:  STD exposure  Bacterial vaginosis    Rx / DC Orders ED Discharge Orders          Ordered    metroNIDAZOLE (FLAGYL) 500 MG tablet  2 times daily        02/18/24 1958    doxycycline (VIBRAMYCIN) 100 MG capsule  2 times daily        02/18/24 1958              Lavonia Drafts 02/18/24 2001    Vanetta Mulders, MD 02/21/24 1745

## 2024-02-18 NOTE — Discharge Instructions (Signed)
 You were seen today for possible exposure to sexual transmitted disease as well as for bacterial vaginosis.  You were given Rocephin as well as having both doxycycline and metronidazole sent to the pharmacy for you to begin taking.  Be sure to take these with food to help prevent stomach upset.  Recommend you continue to use safe sex practices using barrier protection to help prevent sexually transmitted infections.  Your wet prep did show clue cells which were notable for bacterial vaginosis infection.  Your HIV testing was negative today.  Return to the ED for any new or worsening symptoms including worsening pelvic pain, vaginal discharge despite antibiotic treatment, fever.

## 2024-02-19 LAB — GC/CHLAMYDIA PROBE AMP (~~LOC~~) NOT AT ARMC
Chlamydia: NEGATIVE
Comment: NEGATIVE
Comment: NORMAL
Neisseria Gonorrhea: NEGATIVE

## 2024-02-19 LAB — RPR: RPR Ser Ql: NONREACTIVE

## 2024-03-27 ENCOUNTER — Other Ambulatory Visit: Payer: Self-pay

## 2024-03-27 ENCOUNTER — Emergency Department (HOSPITAL_COMMUNITY): Admission: EM | Admit: 2024-03-27 | Discharge: 2024-03-28 | Disposition: A | Discharge status: Home / Self Care

## 2024-03-27 DIAGNOSIS — E876 Hypokalemia: Secondary | ICD-10-CM | POA: Insufficient documentation

## 2024-03-27 DIAGNOSIS — R21 Rash and other nonspecific skin eruption: Secondary | ICD-10-CM | POA: Diagnosis not present

## 2024-03-27 DIAGNOSIS — N3090 Cystitis, unspecified without hematuria: Secondary | ICD-10-CM | POA: Insufficient documentation

## 2024-03-27 DIAGNOSIS — R300 Dysuria: Secondary | ICD-10-CM | POA: Diagnosis present

## 2024-03-27 LAB — COMPREHENSIVE METABOLIC PANEL WITH GFR
ALT: 25 U/L (ref 0–44)
AST: 24 U/L (ref 15–41)
Albumin: 4.3 g/dL (ref 3.5–5.0)
Alkaline Phosphatase: 52 U/L (ref 38–126)
Anion gap: 7 (ref 5–15)
BUN: 12 mg/dL (ref 6–20)
CO2: 23 mmol/L (ref 22–32)
Calcium: 9.4 mg/dL (ref 8.9–10.3)
Chloride: 109 mmol/L (ref 98–111)
Creatinine, Ser: 0.91 mg/dL (ref 0.44–1.00)
GFR, Estimated: >60 mL/min (ref >60)
Glucose, Bld: 101 mg/dL — ABNORMAL HIGH (ref 70–99)
Potassium: 3.4 mmol/L — ABNORMAL LOW (ref 3.5–5.1)
Sodium: 139 mmol/L (ref 135–145)
Total Bilirubin: 1.3 mg/dL — ABNORMAL HIGH (ref 0.0–1.2)
Total Protein: 7.2 g/dL (ref 6.5–8.1)

## 2024-03-27 LAB — CBC WITH DIFFERENTIAL/PLATELET
Abs Immature Granulocytes: 0.01 10*3/uL (ref 0.00–0.07)
Basophils Absolute: 0.1 10*3/uL (ref 0.0–0.1)
Basophils Relative: 1 %
Eosinophils Absolute: 0.1 10*3/uL (ref 0.0–0.5)
Eosinophils Relative: 2 %
HCT: 38.3 % (ref 36.0–46.0)
Hemoglobin: 12.3 g/dL (ref 12.0–15.0)
Immature Granulocytes: 0 %
Lymphocytes Relative: 43 %
Lymphs Abs: 2.3 10*3/uL (ref 0.7–4.0)
MCH: 29.5 pg (ref 26.0–34.0)
MCHC: 32.1 g/dL (ref 30.0–36.0)
MCV: 91.8 fL (ref 80.0–100.0)
Monocytes Absolute: 0.4 10*3/uL (ref 0.1–1.0)
Monocytes Relative: 7 %
Neutro Abs: 2.6 10*3/uL (ref 1.7–7.7)
Neutrophils Relative %: 47 %
Platelets: 231 10*3/uL (ref 150–400)
RBC: 4.17 MIL/uL (ref 3.87–5.11)
RDW: 12.8 % (ref 11.5–15.5)
WBC: 5.4 10*3/uL (ref 4.0–10.5)
nRBC: 0 % (ref 0.0–0.2)

## 2024-03-27 LAB — URINALYSIS, ROUTINE W REFLEX MICROSCOPIC
Bilirubin Urine: NEGATIVE
Glucose, UA: NEGATIVE mg/dL
Ketones, ur: NEGATIVE mg/dL
Nitrite: NEGATIVE
Protein, ur: 30 mg/dL — AB
Specific Gravity, Urine: 1.021 (ref 1.005–1.030)
Squamous Epithelial / HPF: >50 /HPF (ref 0–5)
pH: 5 (ref 5.0–8.0)

## 2024-03-27 LAB — HCG, SERUM, QUALITATIVE: Preg, Serum: NEGATIVE

## 2024-03-27 MED ORDER — CEPHALEXIN 500 MG PO CAPS
500.0000 mg | ORAL_CAPSULE | Freq: Once | ORAL | Status: AC
  Administered 2024-03-28: 500 mg via ORAL
  Filled 2024-03-27: qty 1

## 2024-03-27 MED ORDER — CEPHALEXIN 500 MG PO CAPS
500.0000 mg | ORAL_CAPSULE | Freq: Two times a day (BID) | ORAL | 0 refills | Status: AC
Start: 2024-03-27 — End: 2024-04-01

## 2024-03-27 NOTE — ED Triage Notes (Signed)
 Pt has c/o rash to right thigh, also vaginal burning and itching when she urinates. Pt was recently treated for BV.

## 2024-03-27 NOTE — ED Provider Notes (Signed)
 [place] EMERGENCY DEPARTMENT AT [facility] Provider Note   CSN: [id number] Arrival date & time: 03/27/24  2043     History {Add pertinent medical, surgical, social history, OB history to HPI:1} Chief Complaint  Patient presents with   Rash   Vaginal Itching    Stacey Nguyen is a 23 y.o. female.  HPI     Home Medications Prior to Admission medications   Medication Sig Start Date End Date Taking? Authorizing Provider  cephALEXin  (KEFLEX ) 500 MG capsule Take 1 capsule (500 mg total) by mouth 2 (two) times daily for 5 days. 03/27/24 04/01/24 Yes Huntleigh Doolen R, PA-C  albuterol  (VENTOLIN  HFA) 108 (90 Base) MCG/ACT inhaler Inhale 1-2 puffs into the lungs every 6 (six) hours as needed for wheezing or shortness of breath. 09/20/22   Darlis Eisenmenger, PA-C  fluconazole  (DIFLUCAN ) 150 MG tablet Take 1 tablet (150 mg total) by mouth daily. 01/10/24   Sherel Dikes, PA-C  ondansetron  (ZOFRAN -ODT) 4 MG disintegrating tablet Take 1 tablet (4 mg total) by mouth every 8 (eight) hours as needed for nausea or vomiting. 01/06/24   Royann Cords, PA      Allergies    Patient has no known allergies.    Review of Systems   Review of Systems  Physical Exam Updated Vital Signs BP (!) 143/78   Pulse 74   Temp 98.9 F (37.2 C) (Oral)   Resp 18   LMP 03/22/2024 (Exact Date)   SpO2 100%  Physical Exam  ED Results / Procedures / Treatments   Labs (all labs ordered are listed, but only abnormal results are displayed) Labs Reviewed  COMPREHENSIVE METABOLIC PANEL WITH GFR - Abnormal; Notable for the following components:      Result Value   Potassium 3.4 (*)    Glucose, Bld 101 (*)    Total Bilirubin 1.3 (*)    All other components within normal limits  URINALYSIS, ROUTINE W REFLEX MICROSCOPIC - Abnormal; Notable for the following components:   APPearance CLOUDY (*)    Hgb urine dipstick MODERATE (*)    Protein, ur 30 (*)    Leukocytes,Ua LARGE (*)     Bacteria, UA FEW (*)    All other components within normal limits  URINE CULTURE  CBC WITH DIFFERENTIAL/PLATELET  HCG, SERUM, QUALITATIVE    EKG None  Radiology No results found.  Procedures Procedures  {Document cardiac monitor, telemetry assessment procedure when appropriate:1}  Medications Ordered in ED Medications  cephALEXin  (KEFLEX ) capsule 500 mg (has no administration in time range)    ED Course/ Medical Decision Making/ A&P   {   Click here for ABCD2, HEART and other calculatorsREFRESH Note before signing :1}                              Medical Decision Making Amount and/or Complexity of Data Reviewed Labs: ordered.  Risk Prescription drug management.   ***  {Document critical care time when appropriate:1} {Document review of labs and clinical decision tools ie heart score, Chads2Vasc2 etc:1}  {Document your independent review of radiology images, and any outside records:1} {Document your discussion with family members, caretakers, and with consultants:1} {Document social determinants of health affecting pt's care:1} {Document your decision making why or why not admission, treatments were needed:1} Final Clinical Impression(s) / ED Diagnoses Final diagnoses:  Cystitis    Rx / DC Orders ED Discharge Orders  Ordered    cephALEXin  (KEFLEX ) 500 MG capsule  2 times daily        03/27/24 2354

## 2024-03-27 NOTE — Discharge Instructions (Addendum)
 You have a UTI. Please take the antibiotic as prescribed for the entire course and return to the ER with any new severe symptoms.

## 2024-03-30 LAB — URINE CULTURE: Culture: >=100000 — AB

## 2024-03-31 ENCOUNTER — Telehealth (HOSPITAL_BASED_OUTPATIENT_CLINIC_OR_DEPARTMENT_OTHER): Payer: Self-pay | Admitting: *Deleted

## 2024-03-31 NOTE — Progress Notes (Signed)
 ED Antimicrobial Stewardship Positive Culture Follow Up   Stacey Nguyen is an 23 y.o. female who presented to [facility] on 03/27/2024 with a chief complaint of  Chief Complaint  Patient presents with   Rash   Vaginal Itching    Recent Results (from the past 720 hours)  Urine Culture     Status: Abnormal   Collection Time: 03/27/24  9:53 PM   Specimen: Urine, Clean Catch  Result Value Ref Range Status   Specimen Description   Final    URINE, CLEAN CATCH Performed at [facility], [address], [place], [place] [address]    Special Requests   Final    NONE Performed at [facility], [address] [place], [place] [address]    Culture (A)  Final    >=100,000 COLONIES/mL LACTOBACILLUS SPECIES 20,000 COLONIES/mL YEAST Standardized susceptibility testing for this organism is not available. Performed at [facility], [address], [place], [place] [address]    Report Status 03/30/2024 FINAL  Final    [x]  Treated with cephalexin , sensitivities to cultured organism unknown.   22 YOF presented with urinary frequency, urgency, dysuria x 5 days. Patient reported recent completion of antibiotics for BV. Patient was diagnosed with cystitis and discharged on cephalexin . Due to unknown sensitivities of the isolated organism, call the patient to assess clinical improvement. If symptoms still present, discontinue cephalexin  and start amoxicillin.   Call the patient for a symptom check.  - If improving, continue cephalexin  as prescribed.  - If not improving, discontinue cephalexin  and start amoxicillin 500 mg PO BID x 7 days.    ED Provider: Judie Noun, PA-C  Winnie Haver, PharmD Candidate [phone] [employer] [facility] [employer] 03/31/2024 9:30 AM

## 2024-03-31 NOTE — Telephone Encounter (Signed)
 Post ED Visit - Positive Culture Follow-up: Unsuccessful Patient Follow-up  Culture assessed and recommendations reviewed by:  []  Court Distance, Pharm.D. []  Skeet Duke, Pharm.D., BCPS AQ-ID []  Leslee Rase, Pharm.D., BCPS [x]  Garland Junk, Pharm.D., BCPS []  [place], [place].D., BCPS, AAHIVP []  Alcide Aly, Pharm.D., BCPS, AAHIVP []  Alejo Hurter, PharmD []  Thomasine Flick, PharmD, BCPS  Positive urine culture  Plan: Call pt for symptom check.  If improving- Continue Keflex  as prescribed.  If not improving- change to Amoxicillin 500mg  PO BID x 7 days per Judie Noun, PA-C  Unable to contact patient , letter will be sent to address on file  Stacey Nguyen 03/31/2024, 2:28 PM

## 2024-04-22 ENCOUNTER — Encounter (HOSPITAL_COMMUNITY): Payer: Self-pay

## 2024-04-22 ENCOUNTER — Emergency Department (HOSPITAL_COMMUNITY)
Admission: EM | Admit: 2024-04-22 | Discharge: 2024-04-22 | Disposition: A | Discharge status: Home / Self Care | Attending: Emergency Medicine | Admitting: Emergency Medicine

## 2024-04-22 DIAGNOSIS — N3090 Cystitis, unspecified without hematuria: Secondary | ICD-10-CM | POA: Insufficient documentation

## 2024-04-22 DIAGNOSIS — B9689 Other specified bacterial agents as the cause of diseases classified elsewhere: Secondary | ICD-10-CM | POA: Insufficient documentation

## 2024-04-22 DIAGNOSIS — R109 Unspecified abdominal pain: Secondary | ICD-10-CM | POA: Diagnosis present

## 2024-04-22 DIAGNOSIS — N760 Acute vaginitis: Secondary | ICD-10-CM | POA: Diagnosis not present

## 2024-04-22 LAB — URINALYSIS, W/ REFLEX TO CULTURE (INFECTION SUSPECTED)
Bacteria, UA: NONE SEEN
Bilirubin Urine: NEGATIVE
Glucose, UA: NEGATIVE mg/dL
Hgb urine dipstick: NEGATIVE
Ketones, ur: NEGATIVE mg/dL
Nitrite: NEGATIVE
Protein, ur: NEGATIVE mg/dL
Specific Gravity, Urine: 1.024 (ref 1.005–1.030)
pH: 5 (ref 5.0–8.0)

## 2024-04-22 LAB — WET PREP, GENITAL
Sperm: NONE SEEN
Trich, Wet Prep: NONE SEEN
WBC, Wet Prep HPF POC: >=10 — AB (ref <10)
Yeast Wet Prep HPF POC: NONE SEEN

## 2024-04-22 LAB — PREGNANCY, URINE: Preg Test, Ur: NEGATIVE

## 2024-04-22 MED ORDER — CIPROFLOXACIN HCL 500 MG PO TABS
500.0000 mg | ORAL_TABLET | Freq: Once | ORAL | Status: AC
  Administered 2024-04-22: 500 mg via ORAL
  Filled 2024-04-22: qty 1

## 2024-04-22 MED ORDER — CIPROFLOXACIN HCL 500 MG PO TABS: 500.0000 mg | ORAL_TABLET | Freq: Two times a day (BID) | ORAL | 0 refills | Status: AC

## 2024-04-22 MED ORDER — METRONIDAZOLE 500 MG PO TABS
500.0000 mg | ORAL_TABLET | Freq: Once | ORAL | Status: AC
  Administered 2024-04-22: 500 mg via ORAL
  Filled 2024-04-22: qty 1

## 2024-04-22 MED ORDER — METRONIDAZOLE 500 MG PO TABS: 500.0000 mg | ORAL_TABLET | Freq: Two times a day (BID) | ORAL | 0 refills | Status: DC

## 2024-04-22 NOTE — ED Triage Notes (Signed)
 Pt c/o dysuria x around 1 month.  Pt reports she was previously seen and treated for a UTI for the symptoms haven't gone away.     Pt was prescribed Keflex .

## 2024-04-22 NOTE — ED Provider Notes (Signed)
 [place] EMERGENCY DEPARTMENT AT [facility] Provider Note   CSN: [id number] Arrival date & time: 04/22/24  1339     History  Chief Complaint  Patient presents with   Dysuria    Stacey Nguyen is a 23 y.o. female who was treated for UTI with Keflex , states she initially had some improvement in her symptoms though presents today with worsening of symptoms back to where she was on her initial presentation.  Was initially treated on 15 May, noted multiple ED visits in the last several months for concerns of STI exposure.  She also voices concern of BV, requests treatment for the same.  Has not had any change in her vaginal discharge but is having some vaginal itching.  No nausea, no vomiting, no GI symptoms.  Has a normal appetite and has tolerated oral intake well but states that she does have abdominal discomfort at times when eating.  Denies any hematuria.   Dysuria Associated symptoms: abdominal pain   Associated symptoms: no fever, no nausea and no vomiting        Home Medications Prior to Admission medications   Medication Sig Start Date End Date Taking? Authorizing Provider  albuterol  (VENTOLIN  HFA) 108 (90 Base) MCG/ACT inhaler Inhale 1-2 puffs into the lungs every 6 (six) hours as needed for wheezing or shortness of breath. 09/20/22   Darlis Eisenmenger, PA-C  fluconazole  (DIFLUCAN ) 150 MG tablet Take 1 tablet (150 mg total) by mouth daily. 01/10/24   Sherel Dikes, PA-C  ondansetron  (ZOFRAN -ODT) 4 MG disintegrating tablet Take 1 tablet (4 mg total) by mouth every 8 (eight) hours as needed for nausea or vomiting. 01/06/24   Royann Cords, PA      Allergies    Patient has no known allergies.    Review of Systems   Review of Systems  Constitutional:  Negative for appetite change, chills and fever.  Gastrointestinal:  Positive for abdominal pain. Negative for abdominal distention, diarrhea, nausea and vomiting.  Genitourinary:  Positive for dysuria.   Neurological:  Negative for dizziness and weakness.  All other systems reviewed and are negative.   Physical Exam Updated Vital Signs BP (!) 154/86 (BP Location: Left Arm)   Pulse (!) 102   Temp 98.5 F (36.9 C) (Oral)   Resp 16   Ht 5' 3 (1.6 m)   Wt 55.8 kg   LMP 04/14/2024 (Exact Date)   SpO2 100%   BMI 21.79 kg/m  Physical Exam Vitals and nursing note reviewed.  Constitutional:      General: She is not in acute distress.    Appearance: Normal appearance.  HENT:     Head: Normocephalic and atraumatic.     Mouth/Throat:     Mouth: Mucous membranes are moist.     Pharynx: Oropharynx is clear.  Eyes:     Extraocular Movements: Extraocular movements intact.     Conjunctiva/sclera: Conjunctivae normal.     Pupils: Pupils are equal, round, and reactive to light.  Cardiovascular:     Rate and Rhythm: Normal rate and regular rhythm.     Pulses: Normal pulses.     Heart sounds: Normal heart sounds. No murmur heard.    No friction rub. No gallop.  Pulmonary:     Effort: Pulmonary effort is normal.     Breath sounds: Normal breath sounds.  Abdominal:     General: Abdomen is flat. Bowel sounds are normal.     Palpations: Abdomen is soft.  Tenderness: There is no abdominal tenderness. There is no right CVA tenderness, left CVA tenderness or guarding.  Musculoskeletal:        General: Normal range of motion.     Cervical back: Normal range of motion and neck supple.  Skin:    General: Skin is warm and dry.     Capillary Refill: Capillary refill takes less than 2 seconds.  Neurological:     General: No focal deficit present.     Mental Status: She is alert. Mental status is at baseline.  Psychiatric:        Mood and Affect: Mood normal.     ED Results / Procedures / Treatments   Labs (all labs ordered are listed, but only abnormal results are displayed) Labs Reviewed  URINALYSIS, W/ REFLEX TO CULTURE (INFECTION SUSPECTED) - Abnormal; Notable for the following  components:      Result Value   APPearance HAZY (*)    Leukocytes,Ua LARGE (*)    All other components within normal limits  WET PREP, GENITAL  PREGNANCY, URINE  GC/CHLAMYDIA PROBE AMP ([place]) NOT AT [facility]    EKG None  Radiology No results found.  Procedures Procedures    Medications Ordered in ED Medications - No data to display  ED Course/ Medical Decision Making/ A&P                                 Medical Decision Making Amount and/or Complexity of Data Reviewed Labs: ordered.  Risk Prescription drug management.   Medical Decision Making:   Stacey Nguyen is a 23 y.o. female who presented to the ED today with lower abdominal pain and dysuria detailed above.     Complete initial physical exam performed, notably the patient  was alert and oriented in no apparent distress.  She has no abdominal tenderness of note, no suprapubic tenderness.  Abdominal auscultation is unremarkable with normal present bowel sounds.     Reviewed and confirmed nursing documentation for past medical history, family history, social history.    Initial Assessment:   With the patient's presentation of abdominal discomfort and dysuria, most likely diagnosis is cystitis and possible pyelonephritis. Other diagnoses were considered including (but not limited to) vaginal candidiasis, bacterial vaginosis.   Initial Plan:  Obtain gonorrhea chlamydia swab as well as wet prep to assess for presence of BV/candidiasis/bacterial STI. Assess urine for presence of potential UTI/urological origin. Objective evaluation as below reviewed   Initial Study Results:   Laboratory  All laboratory results reviewed without evidence of clinically relevant pathology.   Exceptions include: Clue cells present as well as white blood cells greater than 10 on wet prep.  Large presence of leukocyte Estrace on UA along with C.   Reassessment and Plan:   Given continued presence of leukocyte esterase and  hazy appearing urine, along with wet prep, findings consistent with bacterial vaginosis, will treat both with new course of Cipro  to manage UTI as well as Flagyl  to manage bacterial vaginosis.  Referrals given for follow-up to primary care/gynecology for continued follow-up of her conditions.          Final Clinical Impression(s) / ED Diagnoses Final diagnoses:  None    Rx / DC Orders ED Discharge Orders     None         Juanetta Nordmann, PA 04/22/24 1715    Teddi Favors, DO 04/30/24 [phone]

## 2024-04-23 LAB — GC/CHLAMYDIA PROBE AMP (~~LOC~~) NOT AT ARMC
Chlamydia: NEGATIVE
Comment: NEGATIVE
Comment: NORMAL
Neisseria Gonorrhea: NEGATIVE

## 2024-06-26 ENCOUNTER — Other Ambulatory Visit: Payer: Self-pay

## 2024-06-26 ENCOUNTER — Emergency Department (HOSPITAL_COMMUNITY)
Admission: EM | Admit: 2024-06-26 | Discharge: 2024-06-26 | Disposition: A | Discharge status: Home / Self Care | Attending: Emergency Medicine | Admitting: Emergency Medicine

## 2024-06-26 ENCOUNTER — Encounter (HOSPITAL_COMMUNITY): Payer: Self-pay | Admitting: Emergency Medicine

## 2024-06-26 DIAGNOSIS — N760 Acute vaginitis: Secondary | ICD-10-CM | POA: Insufficient documentation

## 2024-06-26 DIAGNOSIS — B9689 Other specified bacterial agents as the cause of diseases classified elsewhere: Secondary | ICD-10-CM

## 2024-06-26 DIAGNOSIS — N898 Other specified noninflammatory disorders of vagina: Secondary | ICD-10-CM | POA: Diagnosis present

## 2024-06-26 LAB — WET PREP, GENITAL
Sperm: NONE SEEN
Trich, Wet Prep: NONE SEEN
WBC, Wet Prep HPF POC: <10 (ref <10)
Yeast Wet Prep HPF POC: NONE SEEN

## 2024-06-26 MED ORDER — METRONIDAZOLE 1 % EX GEL: Freq: Every day | CUTANEOUS | 0 refills | Status: AC

## 2024-06-26 NOTE — ED Triage Notes (Signed)
 Patient c/o vaginal itching and discharge. Patient wants to be check for STD. Patient denies N/V. Patient denies fever. Hx of Bacterial Vaginosis.

## 2024-06-26 NOTE — ED Provider Notes (Signed)
 [place] EMERGENCY DEPARTMENT AT [facility] Provider Note   CSN: [id number] Arrival date & time: 06/26/24  1332     Patient presents with: Vaginal Itching and SEXUALLY TRANSMITTED DISEASE   Stacey Nguyen is a 23 y.o. female.   Stacey Nguyen is a 23 y.o. female w/ a PMH of recurrent bacterial vaginosis presenting today for vaginal odor. Patient reports that she came to the ED 2x (Dec, June) and was diagnosed w/ BV. She reports being prescribed and taking Doxy/Metronidazole  but reports that the symptoms return ~2 weeks after finishing the course of abx. The patient reports that only the vaginal cream has worked for her thus far; requesting a prescription. The patient says she has a fishy vaginal odor with mild itching for the last 3 weeks. No discharge, no pain, no erythema, no swelling, no abdominal/pelvic pain. Not sexually active. Denies STIs, pregnancy. Does not want a pelvic exam, want to self swab.    Vaginal Itching Pertinent negatives include no abdominal pain.       Prior to Admission medications   Medication Sig Start Date End Date Taking? Authorizing Provider  albuterol  (VENTOLIN  HFA) 108 (90 Base) MCG/ACT inhaler Inhale 1-2 puffs into the lungs every 6 (six) hours as needed for wheezing or shortness of breath. 09/20/22   Beverley Leita LABOR, PA-C  ciprofloxacin  (CIPRO ) 500 MG tablet Take 1 tablet (500 mg total) by mouth every 12 (twelve) hours. 04/22/24   Myriam Dorn BROCKS, PA  fluconazole  (DIFLUCAN ) 150 MG tablet Take 1 tablet (150 mg total) by mouth daily. 01/10/24   Vicky Charleston, PA-C  metroNIDAZOLE  (FLAGYL ) 500 MG tablet Take 1 tablet (500 mg total) by mouth 2 (two) times daily. 04/22/24   Myriam Dorn BROCKS, PA  ondansetron  (ZOFRAN -ODT) 4 MG disintegrating tablet Take 1 tablet (4 mg total) by mouth every 8 (eight) hours as needed for nausea or vomiting. 01/06/24   Minnie Tinnie BRAVO, PA    Allergies: Patient has no known allergies.    Review  of Systems  Constitutional:  Negative for chills and fever.  Gastrointestinal:  Negative for abdominal pain, nausea and vomiting.  Genitourinary:  Positive for vaginal discharge. Negative for pelvic pain.    Updated Vital Signs BP 118/75   Pulse 88   Temp 98 F (36.7 C)   Resp 16   Ht 5' 3 (1.6 m)   Wt 55.8 kg   SpO2 100%   BMI 21.79 kg/m   Physical Exam Vitals and nursing note reviewed.  Constitutional:      General: She is not in acute distress.    Appearance: Normal appearance. She is well-developed. She is not ill-appearing or diaphoretic.  HENT:     Head: Normocephalic and atraumatic.  Eyes:     General:        Right eye: No discharge.        Left eye: No discharge.  Cardiovascular:     Rate and Rhythm: Normal rate.  Pulmonary:     Effort: Pulmonary effort is normal. No respiratory distress.  Abdominal:     General: Bowel sounds are normal. There is no distension.     Palpations: Abdomen is soft. There is no mass.     Tenderness: There is no abdominal tenderness. There is no guarding.     Comments: Abdomen soft, nondistended, nontender to palpation in all quadrants without guarding or peritoneal signs  Genitourinary:    Comments: Patient deferred pelvic exam.  Self swab performed Musculoskeletal:  General: No deformity.  Skin:    General: Skin is warm and dry.     Capillary Refill: Capillary refill takes less than 2 seconds.  Neurological:     Mental Status: She is alert and oriented to person, place, and time.     Coordination: Coordination normal.     Comments: Speech is clear, able to follow commands Moves extremities without ataxia, coordination intact  Psychiatric:        Mood and Affect: Mood normal.        Behavior: Behavior normal.     (all labs ordered are listed, but only abnormal results are displayed) Labs Reviewed  WET PREP, GENITAL - Abnormal; Notable for the following components:      Result Value   Clue Cells Wet Prep HPF POC  PRESENT (*)    All other components within normal limits    EKG: None  Radiology: No results found.   Procedures   Medications Ordered in the ED - No data to display                                  Medical Decision Making  Patient with vaginal odor, and itching, history of recurrent BV.  Wet prep consistent with BV without concomitant yeast infection or white blood cells.  Prescribed MetroGel  and given information for OB/GYN follow-up.  Discharged home in good condition.     Final diagnoses:  BV (bacterial vaginosis)  Like to read  ED Discharge Orders     None          Alva Larraine JULIANNA DEVONNA 06/26/24 ARDELLA Freddi Hamilton, MD 06/27/24 2350

## 2024-06-26 NOTE — Medical Student Note (Incomplete)
 [facility]-EMERGENCY DEPT Provider Student Note For educational purposes for Medical, PA and NP students only and not part of the legal medical record.   CSN: [id number] Arrival date & time: 06/26/24  1332      History   Chief Complaint Chief Complaint  Patient presents with   Vaginal Itching   SEXUALLY TRANSMITTED DISEASE    HPI Stacey Nguyen is a 23 y.o. female w/ a PMH of chronic bacterial vaginosis presenting today for vaginal odor. Patient reports that she came to the ED 2x (Dec, June) and was diagnosed w/ BV. She reports being prescribed and taking Doxy/Metro but reports that the symptoms return ~2 weeks after finishing the course of abx. The patient reports that only the vaginal cream has worked for her thus far; requesting a prescription. The patient says she has a fishy vaginal odor with mild itching for the last 3 weeks. No discharge, no pain, no erythema, no swelling, no abdominal/pelvic pain. Not sexually active. Denies STIs, pregnancy. Does not want a pelvic exam, want to self swab.    Vaginal Itching    History reviewed. No pertinent past medical history.  There are no active problems to display for this patient.   History reviewed. No pertinent surgical history.  OB History   No obstetric history on file.      Home Medications    Prior to Admission medications   Medication Sig Start Date End Date Taking? Authorizing Provider  albuterol  (VENTOLIN  HFA) 108 (90 Base) MCG/ACT inhaler Inhale 1-2 puffs into the lungs every 6 (six) hours as needed for wheezing or shortness of breath. 09/20/22   Beverley Leita LABOR, PA-C  ciprofloxacin  (CIPRO ) 500 MG tablet Take 1 tablet (500 mg total) by mouth every 12 (twelve) hours. 04/22/24   Myriam Dorn BROCKS, PA  fluconazole  (DIFLUCAN ) 150 MG tablet Take 1 tablet (150 mg total) by mouth daily. 01/10/24   Vicky Charleston, PA-C  metroNIDAZOLE  (FLAGYL ) 500 MG tablet Take 1 tablet (500 mg total) by mouth 2 (two) times daily.  04/22/24   Myriam Dorn BROCKS, PA  ondansetron  (ZOFRAN -ODT) 4 MG disintegrating tablet Take 1 tablet (4 mg total) by mouth every 8 (eight) hours as needed for nausea or vomiting. 01/06/24   Minnie Tinnie BRAVO, PA    Family History Family History  Problem Relation Age of Onset   Healthy Mother    Healthy Father     Social History Social History   Tobacco Use   Smoking status: Every Day    Current packs/day: 0.50    Types: Cigarettes   Smokeless tobacco: Never  Vaping Use   Vaping status: Former   Substances: Nicotine  Substance Use Topics   Alcohol use: Yes    Comment: occ   Drug use: Yes    Types: Marijuana     Allergies   Patient has no known allergies.   Review of Systems Review of Systems   Physical Exam Updated Vital Signs BP 118/75   Pulse 88   Temp 98 F (36.7 C)   Resp 16   Ht 5' 3 (1.6 m)   Wt 55.8 kg   SpO2 100%   BMI 21.79 kg/m   Physical Exam   ED Treatments / Results  Labs (all labs ordered are listed, but only abnormal results are displayed) Labs Reviewed - No data to display  EKG  Radiology No results found.  Procedures Procedures (including critical care time)  Medications Ordered in ED Medications - No data to  display   Initial Impression / Assessment and Plan / ED Course  I have reviewed the triage vital signs and the nursing notes.  Pertinent labs & imaging results that were available during my care of the patient were reviewed by me and considered in my medical decision making (see chart for details).     ***  Final Clinical Impressions(s) / ED Diagnoses   Final diagnoses:  None    New Prescriptions New Prescriptions   No medications on file

## 2024-06-26 NOTE — Discharge Instructions (Addendum)
 Your wet prep was consistent with bacterial vaginosis.  Use MetroGel  once daily as directed.  Follow-up with OB/GYN for further management of repeat BV infections.

## 2024-07-10 ENCOUNTER — Emergency Department (HOSPITAL_COMMUNITY)
Admission: EM | Admit: 2024-07-10 | Discharge: 2024-07-10 | Disposition: A | Discharge status: Home / Self Care | Attending: Emergency Medicine | Admitting: Emergency Medicine

## 2024-07-10 ENCOUNTER — Encounter (HOSPITAL_COMMUNITY): Payer: Self-pay

## 2024-07-10 ENCOUNTER — Other Ambulatory Visit: Payer: Self-pay

## 2024-07-10 DIAGNOSIS — R140 Abdominal distension (gaseous): Secondary | ICD-10-CM | POA: Diagnosis present

## 2024-07-10 DIAGNOSIS — K5901 Slow transit constipation: Secondary | ICD-10-CM

## 2024-07-10 DIAGNOSIS — R1013 Epigastric pain: Secondary | ICD-10-CM | POA: Insufficient documentation

## 2024-07-10 NOTE — ED Provider Notes (Signed)
 [place] EMERGENCY DEPARTMENT AT [facility] Provider Note   CSN: [id number] Arrival date & time: 07/10/24  1536     Patient presents with: Stacey Nguyen Deasia Chiu is a 23 y.o. female who presents emergency department with a chief complaint of stomach upset.  Patient reports that she had onset of stomach pain after eating and drinking, water  brash, coated tongue, bloating and gas with associated belching.  Patient reports that she does not have frequent bowel movements.  She has not had a cut that cathartic bowel movement in some time.  She has a mild nausea occasionally but denies any vomiting fevers chills abdominal distention or tenderness. Patient thinks that she could have H. pylori because she read about it on the Internet.  Her symptoms been going on for about a week after she ate what she thinks was likely a bad piece of broccoli.  She does report that she tries to eat fruits and vegetables regularly she does not take fiber supplementation.  She does not think that she drinks enough water  regularly but does try to incorporate into her diet.  Who is on for she I am in a.m. I smells good which you make [employer] us  got them and they are like if I am hungry leg bag is going neurostimulation this is much as ago dollar dollar 50 like all my records with supplies is the top of the exam also straightened bullshit gentleman did not know about the patient about [place] through 3   HPI     Prior to Admission medications   Medication Sig Start Date End Date Taking? Authorizing Provider  albuterol  (VENTOLIN  HFA) 108 (90 Base) MCG/ACT inhaler Inhale 1-2 puffs into the lungs every 6 (six) hours as needed for wheezing or shortness of breath. 09/20/22   Beverley Leita LABOR, PA-C  ciprofloxacin  (CIPRO ) 500 MG tablet Take 1 tablet (500 mg total) by mouth every 12 (twelve) hours. 04/22/24   Myriam Dorn BROCKS, PA  fluconazole  (DIFLUCAN ) 150 MG tablet Take 1 tablet (150 mg total) by mouth  daily. 01/10/24   Vicky Charleston, PA-C  metroNIDAZOLE  (FLAGYL ) 500 MG tablet Take 1 tablet (500 mg total) by mouth 2 (two) times daily. 04/22/24   Myriam Dorn BROCKS, PA  metroNIDAZOLE  (METROGEL ) 1 % gel Apply topically daily. 06/26/24   Kehrli, Kelsey F, PA-C  ondansetron  (ZOFRAN -ODT) 4 MG disintegrating tablet Take 1 tablet (4 mg total) by mouth every 8 (eight) hours as needed for nausea or vomiting. 01/06/24   Minnie Tinnie BRAVO, PA    Allergies: Patient has no known allergies.    Review of Systems  Updated Vital Signs BP (!) 124/90 (BP Location: Right Arm)   Pulse 70   Temp 98.6 F (37 C) (Oral)   Resp 18   Ht 5' 3 (1.6 m)   Wt 55.8 kg   LMP 07/05/2024   SpO2 100%   BMI 21.79 kg/m   Physical Exam Vitals and nursing note reviewed.  Constitutional:      General: She is not in acute distress.    Appearance: She is well-developed. She is not diaphoretic.  HENT:     Head: Normocephalic and atraumatic.     Right Ear: External ear normal.     Left Ear: External ear normal.     Nose: Nose normal.     Mouth/Throat:     Mouth: Mucous membranes are moist.  Eyes:     General: No scleral icterus.    Conjunctiva/sclera:  Conjunctivae normal.  Cardiovascular:     Rate and Rhythm: Normal rate and regular rhythm.     Heart sounds: Normal heart sounds. No murmur heard.    No friction rub. No gallop.  Pulmonary:     Effort: Pulmonary effort is normal. No respiratory distress.     Breath sounds: Normal breath sounds.  Abdominal:     General: Bowel sounds are normal. There is no distension.     Palpations: Abdomen is soft. There is no mass.     Tenderness: There is no abdominal tenderness. There is no guarding.  Musculoskeletal:     Cervical back: Normal range of motion.  Skin:    General: Skin is warm and dry.  Neurological:     Mental Status: She is alert and oriented to person, place, and time.  Psychiatric:        Behavior: Behavior normal.    (all labs ordered are listed,  but only abnormal results are displayed) Labs Reviewed - No data to display  EKG: None  Radiology: No results found.   Procedures   Medications Ordered in the ED - No data to display                                  Medical Decision Making  23 year old female with dyspepsia, bloating and gas, suspected slow transit constipation.,  Pain after eating.  Low suspicion for peptic ulcer disease or gastritis.  I also have low suspicion for biliary colic.  She has a benign abdominal exam.  Patient was concerned she could have H. pylori infection.  Suspect this is more likely related to slow digestion and some constipation.  At this time we will try Pepcid MiraLAX Dulcolax increase fluid ex intake and dietary modifications along with exercise.  Will also add simethicone for gas relief.  I have discussed outpatient follow-up and return precautions.  She appears otherwise appropriate for discharge at this time.     Final diagnoses:  None    ED Discharge Orders     None          Arloa Chroman, PA-C 07/10/24 2254    Doretha Folks, MD 07/11/24 2124

## 2024-07-10 NOTE — ED Triage Notes (Signed)
 Pt presents to ED from home requesting treatment for H Pylori. Pt states, I think I have it because ever since I ate some broccoli my throat is irritated sometimes. And I have been getting full fast and feeling bloated until I move my bowels. Denies current abdominal pain, n/v/d, sore throat, fever, chills.

## 2024-07-10 NOTE — Discharge Instructions (Signed)
 1) take pepcid (famotidine) or pepcid complete 1-2 times a day for the next 30 days 2) use a stimulant laxative like bisacodyl at night before bet to get your bowels moving then take at least one capful of miralax in 8-10 oz of water  daily before bed to keep your bowel moving   READ all the information below.    ### Digestive Symptom Management     **Understanding Your Digestive Symptoms**      Many people experience symptoms like stomach discomfort (dyspepsia), gas, bloating, belching, and constipation. These symptoms are often caused by how the digestive system moves and reacts to food, and are rarely due to serious disease. Most cases can be managed with simple lifestyle changes and over-the-counter treatments.[1][2][3][4][5][6][7][8][9][10][11]      ---      **Diet and Fiber**      - Eating more fiber helps your digestive system work better. Fiber adds bulk to your stool and helps it move through your intestines.      - Aim for a total of 20-30 grams of fiber per day from food and supplements.[4][11]      - Methylcellulose is a type of fiber supplement that does not cause extra gas or bloating because it is non-fermenting. It can be a good choice if you are sensitive to other fibers.[4][11]      - Increase fiber slowly to avoid discomfort, and drink plenty of water  to help fiber work properly.[4][11]      ---      **Hydration and Exercise**      - Drink enough water  every day, especially when increasing fiber. This helps prevent constipation and keeps your digestive system moving.[4][6][11]      - Regular physical activity, such as walking, can help your bowels move and reduce bloating.[6][7][11]      ---      **Medications for Constipation**      - **Polyethylene glycol (Miralax):** This is an osmotic laxative that draws water  into the colon to soften stool. The usual dose is 17 grams (one capful) mixed in a beverage once daily. It is safe for long-term use and does not  cause dependence.[4][10][11]      - **Bisacodyl:** This is a stimulant laxative that helps the colon contract. It is usually taken as a 5 mg tablet once daily, and can be increased to 10 mg if needed. Use for short periods or as rescue therapy, as long-term use may cause cramping or diarrhea.[4][10][11]      - If constipation does not improve with these treatments, further evaluation by a gastroenterologist may be needed.[5][6][7][11]      ---      **Medications for Gas, Bloating, and Dyspepsia**      - **Famotidine (Pepcid):** This medicine reduces stomach acid and can help with indigestion and upper stomach discomfort. It is often used for 8 weeks if symptoms persist after ruling out infection.[2]      - **Simethicone (found in Pepcid Complete and other products):** This helps break up gas bubbles in the stomach and intestines, making it easier to pass gas and reducing bloating.[1][3][8][9]      - These medicines are generally safe, but always follow package instructions or your healthcare provider's advice.      ---      **Dietary Tips**      - Some foods can make symptoms worse, such as those high in fermentable carbohydrates (FODMAPs), fatty foods, and carbonated drinks. Consider limiting these if you notice they  trigger your symptoms.[2][3][8][9]      - If you have been diagnosed with celiac disease or a food intolerance, follow your provider's dietary recommendations.[1][3]      ---      **When to Seek Further Help**      - If your symptoms do not improve after trying these steps, schedule a follow-up with a gastroenterology specialist.[6][7][11]      - Go to the emergency room if you have severe or worsening pain, cannot keep fluids down due to vomiting, or notice blood in your stool.      ---      **Summary**      Most digestive symptoms can be managed with lifestyle changes, fiber supplementation (such as methylcellulose), adequate water  intake, exercise, and  over-the-counter medications like polyethylene glycol, bisacodyl, famotidine, and simethicone. If symptoms persist or worsen, further medical evaluation is important.[1][2][3][4][5][6][7][8][9][10][11]      ### References  1. Gas, Bloating, and Belching: Approach to Evaluation and Management. Jude JM, Cozine EW, Loftus CG. American Family Physician. 2019;99(5):301-309. 2. Functional Dyspepsia: Evaluation and Management. Mounsey A, Barzin A, Rietz A. American Family Physician. 2020;101(2):84-88. 3. AGA Clinical Practice Update on Evaluation and Management of Belching, Abdominal Bloating, and Distention: Expert Review. Moshiree B, Drossman D, Shaukat A. Gastroenterology. 2023;165(3):791-800.e3. doi:10.1053/j.gastro.2023.04.039. 4. American Gastroenterological Association-American College of Gastroenterology Clinical Practice Guideline: Pharmacological Management of Chronic Idiopathic Constipation. Tina LITTIE Farr WD, Imdad A, et al. Gastroenterology. 2023;164(7):1086-1106. doi:10.1053/j.gastro.2023.03.214. 5. Mechanisms, Evaluation, and Management of Chronic Constipation. Marene AE, Lacy BE. Gastroenterology. 2020;158(5):1232-1249.e3. doi:10.1053/j.gastro.2019.12.034. 6. Chronic Constipation in Adults. Devota MARLA Eveline JULIANNA Addie CANDIE American Family Physician. 2022;106(3):299-306. 7. Functional Constipation: Individualising Assessment and Treatment. Pannemans J, Masuy I, Tack J. Drugs. 2020;80(10):947-963. doi:10.1007/[id number]-z. 8. Management of Chronic Abdominal Distension and Bloating. Lacy BE, Cangemi D, Vazquez-Roque M. Clinical Gastroenterology and Hepatology : The Official Clinical Practice Journal of the American Gastroenterological Association. 2021;19(2):219-231.e1. doi:10.1016/j.cgh.2020.03.056. 9. Updates in Functional Dyspepsia and Bloating. Gracelyn BE, Cangemi DJ. Current Opinion in Gastroenterology. 2022;38(6):613-619. doi:10.1097/MOG.0000000000000882. 10. Efficacy and Safety of  Over-the-Counter Therapies for Chronic Constipation: An Updated Systematic Review. Melanee PASS, Brenner DM. The American Journal of Gastroenterology. 2021;116(6):1156-1181. doi:10.14309/ajg.0000000000001222. 11. The American Society of Colon and Rectal Surgeons Clinical Practice Guidelines for the Evaluation and Management of Chronic Constipation. Alavi K, Thorsen AJ, Fang SH, et al. Diseases of the Colon and Rectum. 2024;67(10):1244-1257. doi:10.1097/DCR.0000000000003430.

## 2024-08-15 ENCOUNTER — Emergency Department (HOSPITAL_COMMUNITY)
Admission: EM | Admit: 2024-08-15 | Discharge: 2024-08-15 | Disposition: A | Discharge status: Home / Self Care | Source: Home / Self Care | Attending: Emergency Medicine | Admitting: Emergency Medicine

## 2024-08-15 ENCOUNTER — Emergency Department (HOSPITAL_COMMUNITY)
Admission: EM | Admit: 2024-08-15 | Discharge: 2024-08-15 | Disposition: A | Discharge status: Home / Self Care | Attending: Emergency Medicine | Admitting: Emergency Medicine

## 2024-08-15 ENCOUNTER — Encounter (HOSPITAL_COMMUNITY): Payer: Self-pay

## 2024-08-15 ENCOUNTER — Emergency Department (HOSPITAL_COMMUNITY)

## 2024-08-15 ENCOUNTER — Other Ambulatory Visit: Payer: Self-pay

## 2024-08-15 DIAGNOSIS — R519 Headache, unspecified: Secondary | ICD-10-CM | POA: Diagnosis present

## 2024-08-15 DIAGNOSIS — R509 Fever, unspecified: Secondary | ICD-10-CM | POA: Diagnosis present

## 2024-08-15 DIAGNOSIS — J111 Influenza due to unidentified influenza virus with other respiratory manifestations: Secondary | ICD-10-CM | POA: Diagnosis not present

## 2024-08-15 LAB — RESP PANEL BY RT-PCR (RSV, FLU A&B, COVID)  RVPGX2
Influenza A by PCR: NEGATIVE
Influenza B by PCR: NEGATIVE
Resp Syncytial Virus by PCR: NEGATIVE
SARS Coronavirus 2 by RT PCR: NEGATIVE

## 2024-08-15 LAB — URINALYSIS, MICROSCOPIC (REFLEX)

## 2024-08-15 LAB — URINALYSIS, ROUTINE W REFLEX MICROSCOPIC
Glucose, UA: NEGATIVE mg/dL
Hgb urine dipstick: NEGATIVE
Ketones, ur: >80 mg/dL — AB
Nitrite: NEGATIVE
Specific Gravity, Urine: 1.025 (ref 1.005–1.030)
pH: 5.5 (ref 5.0–8.0)

## 2024-08-15 LAB — PREGNANCY, URINE: Preg Test, Ur: NEGATIVE

## 2024-08-15 MED ORDER — IBUPROFEN 800 MG PO TABS: 800.0000 mg | ORAL_TABLET | Freq: Three times a day (TID) | ORAL | 0 refills | Status: AC

## 2024-08-15 MED ORDER — ACETAMINOPHEN 500 MG PO TABS
1000.0000 mg | ORAL_TABLET | Freq: Once | ORAL | Status: AC
  Administered 2024-08-15: 1000 mg via ORAL
  Filled 2024-08-15: qty 2

## 2024-08-15 MED ORDER — IBUPROFEN 800 MG PO TABS
800.0000 mg | ORAL_TABLET | Freq: Once | ORAL | Status: AC
  Administered 2024-08-15: 800 mg via ORAL
  Filled 2024-08-15: qty 1

## 2024-08-15 MED ORDER — BENZONATATE 100 MG PO CAPS: 100.0000 mg | ORAL_CAPSULE | Freq: Three times a day (TID) | ORAL | 0 refills | Status: AC

## 2024-08-15 NOTE — Discharge Instructions (Addendum)
 Exam, imaging, labs were reassuring today.  It is advised to take ibuprofen as needed for symptoms.  Follow-up with primary care for further evaluation of illness.  If symptoms worsen or other concerning symptoms arise return to ED for further evaluation.

## 2024-08-15 NOTE — ED Provider Notes (Signed)
 [place] EMERGENCY DEPARTMENT AT [facility] Provider Note   CSN: [id number] Arrival date & time: 08/15/24  1652     Patient presents with: Headache   Stacey Nguyen is a 23 y.o. female.  23 year old female presents to the ED with complaints of headache, weakness, body aches, headache for 3 days.  Patient was seen in the ED last night and given Tylenol  and had negative respiratory panel screen.  Patient reports Tylenol  helped a little bit and she has taken ibuprofen at home with some relief.  Patient is returning today because her headache is getting worse and while she was able to eat his applesauce.  Patient has not had any vomiting but does endorse some nausea.  Patient reports she has been febrile at home.     Prior to Admission medications   Medication Sig Start Date End Date Taking? Authorizing Provider  ibuprofen (ADVIL) 800 MG tablet Take 1 tablet (800 mg total) by mouth 3 (three) times daily. 08/15/24  Yes Myriam Fonda RAMAN, PA-C  albuterol  (VENTOLIN  HFA) 108 (90 Base) MCG/ACT inhaler Inhale 1-2 puffs into the lungs every 6 (six) hours as needed for wheezing or shortness of breath. 09/20/22   Beverley Leita LABOR, PA-C  benzonatate  (TESSALON ) 100 MG capsule Take 1 capsule (100 mg total) by mouth every 8 (eight) hours. 08/15/24   Vicky Charleston, PA-C  ciprofloxacin  (CIPRO ) 500 MG tablet Take 1 tablet (500 mg total) by mouth every 12 (twelve) hours. 04/22/24   Myriam Dorn BROCKS, PA  fluconazole  (DIFLUCAN ) 150 MG tablet Take 1 tablet (150 mg total) by mouth daily. 01/10/24   Vicky Charleston, PA-C  metroNIDAZOLE  (FLAGYL ) 500 MG tablet Take 1 tablet (500 mg total) by mouth 2 (two) times daily. 04/22/24   Myriam Dorn BROCKS, PA  metroNIDAZOLE  (METROGEL ) 1 % gel Apply topically daily. 06/26/24   Kehrli, Kelsey F, PA-C  ondansetron  (ZOFRAN -ODT) 4 MG disintegrating tablet Take 1 tablet (4 mg total) by mouth every 8 (eight) hours as needed for nausea or vomiting. 01/06/24   Minnie Tinnie BRAVO, PA    Allergies: Patient has no known allergies.    Review of Systems  Constitutional:  Positive for fever.  HENT:  Positive for sore throat.   Respiratory:  Positive for cough and shortness of breath.   Neurological:  Positive for headaches.  All other systems reviewed and are negative.   Updated Vital Signs BP 122/86   Pulse 77   Temp 98.4 F (36.9 C) (Oral)   Resp 16   Ht 5' 3 (1.6 m)   Wt 54.4 kg   LMP 08/01/2024 (Approximate)   SpO2 100%   BMI 21.26 kg/m   Physical Exam Vitals and nursing note reviewed.  Constitutional:      Appearance: Normal appearance.  HENT:     Head: Normocephalic and atraumatic.     Nose: Nose normal.  Eyes:     General: No visual field deficit.    Extraocular Movements: Extraocular movements intact.     Conjunctiva/sclera: Conjunctivae normal.     Pupils: Pupils are equal, round, and reactive to light.  Cardiovascular:     Rate and Rhythm: Normal rate.  Pulmonary:     Effort: Pulmonary effort is normal. No respiratory distress.     Breath sounds: Normal breath sounds.  Abdominal:     Palpations: Abdomen is soft.     Tenderness: There is no abdominal tenderness.  Musculoskeletal:        General: Normal range  of motion.     Cervical back: Normal range of motion.  Skin:    General: Skin is warm.     Capillary Refill: Capillary refill takes less than 2 seconds.  Neurological:     General: No focal deficit present.     Mental Status: She is alert and oriented to person, place, and time.     Cranial Nerves: No cranial nerve deficit or facial asymmetry.     Sensory: No sensory deficit.     Motor: No weakness.  Psychiatric:        Mood and Affect: Mood normal.        Behavior: Behavior normal.     (all labs ordered are listed, but only abnormal results are displayed) Labs Reviewed  URINALYSIS, ROUTINE W REFLEX MICROSCOPIC - Abnormal; Notable for the following components:      Result Value   APPearance CLOUDY (*)     Bilirubin Urine SMALL (*)    Ketones, ur >80 (*)    Protein, ur TRACE (*)    Leukocytes,Ua MODERATE (*)    All other components within normal limits  URINALYSIS, MICROSCOPIC (REFLEX) - Abnormal; Notable for the following components:   Bacteria, UA RARE (*)    All other components within normal limits  PREGNANCY, URINE    EKG: None  Radiology: DG Chest Portable 1 View Result Date: 08/15/2024 EXAM: 1 VIEW(S) XRAY OF THE CHEST 08/15/2024 06:14:00 PM COMPARISON: Comparison 06/27/2023. CLINICAL HISTORY: Headache, generalized body aches, fatigue, slight SOB, sore throat for 2 days. Negative swab. Former smoker. FINDINGS: LUNGS AND PLEURA: No focal pulmonary opacity. No pulmonary edema. No pleural effusion. No pneumothorax. HEART AND MEDIASTINUM: No acute abnormality of the cardiac and mediastinal silhouettes. BONES AND SOFT TISSUES: No acute osseous abnormality. IMPRESSION: 1. No acute cardiopulmonary process. Electronically signed by: Suzen Dials MD 08/15/2024 06:27 PM EDT RP Workstation: [id number]     Procedures   Medications Ordered in the ED  ibuprofen (ADVIL) tablet 800 mg (800 mg Oral Given 08/15/24 1911)    23 y.o. female presents to the ED with complaints of bodyaches, nausea, headache weakness, this involves an extensive number of treatment options, and is a complaint that carries with it a high risk of complications and morbidity.  The differential diagnosis includes viral illness, UTI, pneumonia, strep pharyngitis, COVID flu RSV, migraine, tension headache, (Ddx)  On arrival pt is nontoxic, vitals unremarkable.   I ordered medication ibuprofen for pain  Lab Tests:  I Ordered, reviewed, and interpreted labs, which included: UA and pregnancy  Imaging Studies ordered:  I ordered imaging studies which included chest x-ray, I independently visualized and interpreted imaging which showed no acute abnormalities  ED Course:   23 year old female presents to the ED with  complaints of headache,, body aches.  Patient reports has been going on for 3 days and she has already been seen in the ED once for symptoms.  Patient advises she is followed COVID RSV negative.  Patient has a Centor score of 0 and visible exudates or erythema in her tonsils.  Lungs are clear to auscultation in all fields and patient has mild cough nonproductive.  Chest x-ray is negative for any acute findings.  Patient reported she does have relief with ibuprofen.  Patient reports headache is 7 out of 10 and has been going on for several days.  Patient reports it is on the right side of her head has relief with ibuprofen.  There is no visual disturbances or focal deficits on  exam.  Patient has no weaknesses in extremities.  Patient reports some mild urinary symptoms UA will be collected for further evaluation.  Viral illness is most likely suspected.  UA was contaminated, pregnancy was negative, chest x-ray unremarkable.  Patient reported relief of headache following ibuprofen.  Patient is sitting comfortably in ED and advised that she is feeling much better after the ibuprofen.  Patient was advised to follow-up with PCP.  It was advised to use ibuprofen in the meantime for symptoms.  Patient was given return precautions and agreed with treatment plan and was comfortable discharge.   Portions of this note were generated with Scientist, clinical (histocompatibility and immunogenetics). Dictation errors may occur despite best attempts at proofreading.   Final diagnoses:  Influenza-like illness    ED Discharge Orders          Ordered    ibuprofen (ADVIL) 800 MG tablet  3 times daily        08/15/24 2142               Myriam Fonda GORMAN DEVONNA 08/15/24 2222    Randol Simmonds, MD 08/17/24 [phone]

## 2024-08-15 NOTE — ED Provider Notes (Signed)
 [facility]-EMERGENCY DEPT [facility] Emergency Department Provider Note MRN:  [id number]  Arrival date & time: 08/15/24     Chief Complaint   Generalized Body Aches   History of Present Illness   Stacey Nguyen is a 23 y.o. year-old female presents to the ED with chief complaint of fever, chills, generalized bodyaches.  She states that she has had some associated sore throat, dry intermittent cough, and some associated nausea.  She denies known sick contacts.  States that she took a COVID test yesterday that was negative.  Denies pregnancy or breast-feeding.  History provided by patient.   Review of Systems  Pertinent positive and negative review of systems noted in HPI.    Physical Exam   Vitals:   08/15/24 0303  BP: 134/77  Pulse: 97  Resp: 19  Temp: (!) 101.3 F (38.5 C)  SpO2: 100%    CONSTITUTIONAL:  non toxic-appearing, NAD NEURO:  Alert and oriented x 3, CN 3-12 grossly intact EYES:  eyes equal and reactive ENT/NECK:  Supple, no stridor, mild erythema of the oropharynx CARDIO:  normal rate, regular rhythm, appears well-perfused  PULM:  No respiratory distress,  GI/GU:  non-distended,  MSK/SPINE:  No gross deformities, no edema, moves all extremities  SKIN:  no rash, atraumatic   *Additional and/or pertinent findings included in MDM below  Diagnostic and Interventional Summary    EKG Interpretation Date/Time:    Ventricular Rate:    PR Interval:    QRS Duration:    QT Interval:    QTC Calculation:   R Axis:      Text Interpretation:         Labs Reviewed  RESP PANEL BY RT-PCR (RSV, FLU A&B, COVID)  [id number]    No orders to display    Medications  acetaminophen  (TYLENOL ) tablet 1,000 mg (1,000 mg Oral Given 08/15/24 0323)     Procedures  /  Critical Care Procedures  ED Course and Medical Decision Making  I have reviewed the triage vital signs, the nursing notes, and pertinent available records from the EMR.  Social Determinants  Affecting Complexity of Care: Patient has no clinically significant social determinants affecting this chief complaint..   ED Course:    Medical Decision Making Patient here with generalized bodyaches, sore throat, and headache for the past couple of days.  She has been taking ibuprofen.  She is noted to be febrile to 101.3.  Her symptoms sound viral.  Oropharynx is mildly erythematous, but she has had some dry cough.  No marked tonsillar exudates or abscess.  Doubt strep throat.  I have a higher degree of suspicion for COVID, will check RVP.  COVID is negative.  Flu is also negative.  Symptoms still sound consistent with viral URI.  I did offer patient chest x-ray and strep test, but this was deferred.  Will have patient follow-up with PCP if not improving.  Risk OTC drugs. Prescription drug management.         Consultants: No consultations were needed in caring for this patient.   Treatment and Plan: Emergency department workup does not suggest an emergent condition requiring admission or immediate intervention beyond  what has been performed at this time. The patient is safe for discharge and has  been instructed to return immediately for worsening symptoms, change in  symptoms or any other concerns    Final Clinical Impressions(s) / ED Diagnoses     ICD-10-CM   1. Influenza-like illness  J11.1  ED Discharge Orders          Ordered    benzonatate  (TESSALON ) 100 MG capsule  Every 8 hours        08/15/24 0418              Discharge Instructions Discussed with and Provided to Patient:   Discharge Instructions   None      Vicky Charleston, PA-C 08/15/24 0420    Jerral Meth, MD 08/15/24 [phone]

## 2024-08-15 NOTE — ED Triage Notes (Signed)
 Patient arrived with generalized body aches, sore throat, and headache for the last two days. Taking Ibuprofen for pain, last dose yesterday.

## 2024-08-15 NOTE — ED Triage Notes (Signed)
 Headache, generalized body aches, fatigue, sore throat for 2 days.  Seen here earlier and had negative swab. Pt requesting ct scan of head.

## 2024-12-10 ENCOUNTER — Emergency Department (HOSPITAL_COMMUNITY): Admission: EM | Admit: 2024-12-10 | Discharge: 2024-12-10 | Disposition: A | Discharge status: Home / Self Care

## 2024-12-10 DIAGNOSIS — N760 Acute vaginitis: Secondary | ICD-10-CM | POA: Diagnosis not present

## 2024-12-10 DIAGNOSIS — B9689 Other specified bacterial agents as the cause of diseases classified elsewhere: Secondary | ICD-10-CM | POA: Insufficient documentation

## 2024-12-10 DIAGNOSIS — N898 Other specified noninflammatory disorders of vagina: Secondary | ICD-10-CM | POA: Diagnosis present

## 2024-12-10 DIAGNOSIS — B379 Candidiasis, unspecified: Secondary | ICD-10-CM | POA: Insufficient documentation

## 2024-12-10 DIAGNOSIS — Z87891 Personal history of nicotine dependence: Secondary | ICD-10-CM | POA: Insufficient documentation

## 2024-12-10 LAB — URINALYSIS, ROUTINE W REFLEX MICROSCOPIC
Bacteria, UA: NONE SEEN
Bilirubin Urine: NEGATIVE
Glucose, UA: NEGATIVE mg/dL
Hgb urine dipstick: NEGATIVE
Ketones, ur: NEGATIVE mg/dL
Nitrite: NEGATIVE
Protein, ur: NEGATIVE mg/dL
Specific Gravity, Urine: 1.024 (ref 1.005–1.030)
pH: 8 (ref 5.0–8.0)

## 2024-12-10 LAB — RAPID HIV SCREEN (HIV 1/2 AB+AG)
HIV 1/2 Antibodies: NONREACTIVE
HIV-1 P24 Antigen - HIV24: NONREACTIVE

## 2024-12-10 LAB — WET PREP, GENITAL
Sperm: NONE SEEN
Trich, Wet Prep: NONE SEEN
WBC, Wet Prep HPF POC: <10

## 2024-12-10 LAB — PREGNANCY, URINE: Preg Test, Ur: NEGATIVE

## 2024-12-10 MED ORDER — METRONIDAZOLE 500 MG PO TABS: 500.0000 mg | ORAL_TABLET | Freq: Two times a day (BID) | ORAL | 0 refills | Status: AC

## 2024-12-10 MED ORDER — FLUCONAZOLE 150 MG PO TABS: 150.0000 mg | ORAL_TABLET | Freq: Every day | ORAL | 0 refills | Status: AC

## 2024-12-10 MED ORDER — FLUCONAZOLE 150 MG PO TABS
150.0000 mg | ORAL_TABLET | Freq: Once | ORAL | Status: AC
  Administered 2024-12-10: 150 mg via ORAL
  Filled 2024-12-10: qty 1

## 2024-12-10 NOTE — ED Triage Notes (Signed)
 Patient reports foul odor from vagina/dysuria/intermittent abdominal pain started 1 week ago. Denies fevers/n/v. Patient is alert and oriented x 4. Airway patent, respirations even and unlabored. Skin normal, warm and dry. Patient has no new complaints at this time.

## 2024-12-10 NOTE — ED Provider Notes (Signed)
 " [place] EMERGENCY DEPARTMENT AT [facility] Provider Note   CSN: [id number] Arrival date & time: 12/10/24  1442     Patient presents with: No chief complaint on file.   Stacey Nguyen is a 24 y.o. female.   24 year old female presenting with vaginal odor/discharge. Symptoms began approximately 1 week ago, denies any new sexual partners, last had intercourse approximately 2 months ago and has not been tested for sexually transmitted diseases since then.  Reports occasional lower abdominal cramping, menstrual cycle recently ended, does not believe that she could be pregnant.  Denies dysuria, nausea/vomiting, fever.        Prior to Admission medications  Medication Sig Start Date End Date Taking? Authorizing Provider  albuterol  (VENTOLIN  HFA) 108 (90 Base) MCG/ACT inhaler Inhale 1-2 puffs into the lungs every 6 (six) hours as needed for wheezing or shortness of breath. 09/20/22   Beverley Leita LABOR, PA-C  benzonatate  (TESSALON ) 100 MG capsule Take 1 capsule (100 mg total) by mouth every 8 (eight) hours. 08/15/24   Vicky Charleston, PA-C  ciprofloxacin  (CIPRO ) 500 MG tablet Take 1 tablet (500 mg total) by mouth every 12 (twelve) hours. 04/22/24   Myriam Dorn BROCKS, PA  fluconazole  (DIFLUCAN ) 150 MG tablet Take 1 tablet (150 mg total) by mouth daily. 01/10/24   Vicky Charleston, PA-C  ibuprofen  (ADVIL ) 800 MG tablet Take 1 tablet (800 mg total) by mouth 3 (three) times daily. 08/15/24   Myriam Fonda RAMAN, PA-C  metroNIDAZOLE  (FLAGYL ) 500 MG tablet Take 1 tablet (500 mg total) by mouth 2 (two) times daily. 04/22/24   Myriam Dorn BROCKS, PA  metroNIDAZOLE  (METROGEL ) 1 % gel Apply topically daily. 06/26/24   Kehrli, Kelsey F, PA-C  ondansetron  (ZOFRAN -ODT) 4 MG disintegrating tablet Take 1 tablet (4 mg total) by mouth every 8 (eight) hours as needed for nausea or vomiting. 01/06/24   Minnie Tinnie BRAVO, PA    Allergies: Patient has no known allergies.    Review of  Systems  Updated Vital Signs  Vitals:   12/10/24 1449 12/10/24 1947  BP: 128/78 103/71  Pulse: 86 72  Resp: 16 17  Temp: 98.1 F (36.7 C) 98.4 F (36.9 C)  TempSrc: Oral Oral  SpO2: 100% 100%     Physical Exam Vitals and nursing note reviewed.  Constitutional:      Appearance: Normal appearance.  HENT:     Head: Normocephalic and atraumatic.  Eyes:     Extraocular Movements: Extraocular movements intact.     Pupils: Pupils are equal, round, and reactive to light.  Cardiovascular:     Rate and Rhythm: Normal rate.  Pulmonary:     Effort: Pulmonary effort is normal.  Abdominal:     Palpations: Abdomen is soft.     Tenderness: There is no abdominal tenderness. There is no guarding.  Genitourinary:    Comments: Pelvic exam deferred Musculoskeletal:     Comments: Moves all extremities spontaneously without difficulty  Skin:    General: Skin is warm and dry.  Neurological:     General: No focal deficit present.     Mental Status: She is alert and oriented to person, place, and time.     (all labs ordered are listed, but only abnormal results are displayed) Labs Reviewed  WET PREP, GENITAL - Abnormal; Notable for the following components:      Result Value   Yeast Wet Prep HPF POC PRESENT (*)    Clue Cells Wet Prep HPF POC  PRESENT (*)    All other components within normal limits  URINALYSIS, ROUTINE W REFLEX MICROSCOPIC - Abnormal; Notable for the following components:   Leukocytes,Ua SMALL (*)    All other components within normal limits  RAPID HIV SCREEN (HIV 1/2 AB+AG)  PREGNANCY, URINE  SYPHILIS: RPR W/REFLEX TO RPR TITER AND TREPONEMAL ANTIBODIES, TRADITIONAL SCREENING AND DIAGNOSIS ALGORITHM  GC/CHLAMYDIA PROBE AMP ([place]) NOT AT [facility]    EKG: None  Radiology: No results found.   Procedures   Medications Ordered in the ED  fluconazole  (DIFLUCAN ) tablet 150 mg (150 mg Oral Given 12/10/24 2103)                                    Medical  Decision Making This patient presents to the ED for concern of vaginal discharge/odor, this involves an extensive number of treatment options, and is a complaint that carries with it a high risk of complications and morbidity.  The differential diagnosis includes bacterial vaginosis, yeast infection, trichomonas, gonorrhea/chlamydia, HIV/syphilis   Co morbidities that complicate the patient evaluation  Former tobacco use   Lab Tests:  I Ordered, and personally interpreted labs.  The pertinent results include: Urinalysis with small leukocytes, likely contaminant from vaginal discharge, no other evidence of infection without the presence of bacteria/nitrites.  Urine pregnancy test negative.  HIV negative.  Wet prep notable for yeast/clue cells.   Cardiac Monitoring: / EKG:  The patient was maintained on a cardiac monitor.  I personally viewed and interpreted the cardiac monitored which showed an underlying rhythm of: NSR  Problem List / ED Course / Critical interventions / Medication management  I ordered medication including fluconazole  for yeast infection I have reviewed the patients home medicines and have made adjustments as needed   Social Determinants of Health:  Former tobacco use   Test / Admission - Considered:  Physical exam is unremarkable as above, discussed performing pelvic exam however patient defers this today and does prefer to self swab for gonorrhea/chlamydia/wet prep, which I feel is reasonable.  Requesting testing for HIV/syphilis as well. Wet prep notable for clue cells and yeast, given first dose of fluconazole  in the emergency department this evening.  Will send 7-day course of Flagyl  to patient's preferred pharmacy, will also send a second dose of fluconazole  which she should take if her vaginal itching persist after 72 hours.  Patient voiced understanding and is in agreement this plan, return precautions discussed, she is appropriate for discharge at this  time. GC probe and HIV/RPR pending at discharge, patient does have access to MyChart and plans to review her MyChart once these results are available.    Amount and/or Complexity of Data Reviewed Labs: ordered.  Risk Prescription drug management.        Final diagnoses:  Bacterial vaginosis  Yeast infection    ED Discharge Orders          Ordered    fluconazole  (DIFLUCAN ) 150 MG tablet  Daily        12/10/24 2058    metroNIDAZOLE  (FLAGYL ) 500 MG tablet  2 times daily        12/10/24 2058               Glendia Rocky SAILOR, PA-C 12/10/24 2321    Ula Prentice SAUNDERS, MD 12/13/24 1117  "

## 2024-12-10 NOTE — ED Notes (Signed)
 AVS provided by edp was reviewed with the pt. Pt verbalized understanding. Pharmacy was verified. Pt understanding of pending test and access to mychart. Pt denies any additional questions at this time.

## 2024-12-10 NOTE — Discharge Instructions (Signed)
 You tested positive today for bacterial vaginosis as well as a yeast infection of your vagina, these are not sexually transmitted infections.  You were given the first dose of an antifungal medication, fluconazole , in the emergency department today if you continue to have vaginal itching after 72 hours you may repeat this dose of fluconazole .  Start Flagyl  and take 1 tab by mouth twice daily for 7 days, please do not drink alcohol while using this medication as it may result in nausea/vomiting. The remainder of your test results will be available on MyChart in the next 24 to 48 hours. Return to the emergency department if your symptoms worsen.

## 2024-12-11 LAB — GC/CHLAMYDIA PROBE AMP (~~LOC~~) NOT AT ARMC
Chlamydia: NEGATIVE
Comment: NEGATIVE
Comment: NORMAL
Neisseria Gonorrhea: NEGATIVE

## 2024-12-11 LAB — SYPHILIS: RPR W/REFLEX TO RPR TITER AND TREPONEMAL ANTIBODIES, TRADITIONAL SCREENING AND DIAGNOSIS ALGORITHM: RPR Ser Ql: NONREACTIVE
